# Patient Record
Sex: Female | Born: 1950 | Race: White | Hispanic: No | Marital: Married | Smoking: Never smoker
Health system: Southern US, Community
[De-identification: ages and names within clinical notes are randomized; demographics above are authoritative.]

## PROBLEM LIST (undated history)

## (undated) DIAGNOSIS — F32A Depression, unspecified: Secondary | ICD-10-CM

## (undated) DIAGNOSIS — C569 Malignant neoplasm of unspecified ovary: Secondary | ICD-10-CM

## (undated) DIAGNOSIS — M1990 Unspecified osteoarthritis, unspecified site: Secondary | ICD-10-CM

## (undated) DIAGNOSIS — C4490 Unspecified malignant neoplasm of skin, unspecified: Secondary | ICD-10-CM

## (undated) DIAGNOSIS — T4145XA Adverse effect of unspecified anesthetic, initial encounter: Secondary | ICD-10-CM

## (undated) DIAGNOSIS — E785 Hyperlipidemia, unspecified: Secondary | ICD-10-CM

## (undated) DIAGNOSIS — T8859XA Other complications of anesthesia, initial encounter: Secondary | ICD-10-CM

## (undated) DIAGNOSIS — K269 Duodenal ulcer, unspecified as acute or chronic, without hemorrhage or perforation: Secondary | ICD-10-CM

## (undated) DIAGNOSIS — I10 Essential (primary) hypertension: Secondary | ICD-10-CM

## (undated) DIAGNOSIS — R002 Palpitations: Secondary | ICD-10-CM

## (undated) DIAGNOSIS — R112 Nausea with vomiting, unspecified: Secondary | ICD-10-CM

## (undated) DIAGNOSIS — E039 Hypothyroidism, unspecified: Secondary | ICD-10-CM

## (undated) DIAGNOSIS — Z9889 Other specified postprocedural states: Secondary | ICD-10-CM

## (undated) DIAGNOSIS — F329 Major depressive disorder, single episode, unspecified: Secondary | ICD-10-CM

## (undated) DIAGNOSIS — B9681 Helicobacter pylori [H. pylori] as the cause of diseases classified elsewhere: Secondary | ICD-10-CM

## (undated) DIAGNOSIS — F419 Anxiety disorder, unspecified: Secondary | ICD-10-CM

## (undated) HISTORY — PX: BACK SURGERY: SHX140

## (undated) HISTORY — PX: CERVICAL FUSION: SHX112

## (undated) HISTORY — PX: TONSILLECTOMY: SUR1361

## (undated) HISTORY — PX: ABDOMINAL HYSTERECTOMY: SHX81

---

## 2002-08-31 DIAGNOSIS — C569 Malignant neoplasm of unspecified ovary: Secondary | ICD-10-CM

## 2002-08-31 HISTORY — DX: Malignant neoplasm of unspecified ovary: C56.9

## 2014-07-23 ENCOUNTER — Ambulatory Visit: Payer: Self-pay | Admitting: Internal Medicine

## 2015-10-16 ENCOUNTER — Other Ambulatory Visit: Payer: Self-pay | Admitting: Internal Medicine

## 2015-10-16 DIAGNOSIS — M5030 Other cervical disc degeneration, unspecified cervical region: Secondary | ICD-10-CM

## 2015-10-16 DIAGNOSIS — Z1231 Encounter for screening mammogram for malignant neoplasm of breast: Secondary | ICD-10-CM

## 2015-11-04 ENCOUNTER — Ambulatory Visit
Admission: RE | Admit: 2015-11-04 | Discharge: 2015-11-04 | Disposition: A | Discharge status: Home / Self Care | Payer: BLUE CROSS/BLUE SHIELD | Source: Ambulatory Visit | Attending: Internal Medicine | Admitting: Internal Medicine

## 2015-11-04 DIAGNOSIS — Z1231 Encounter for screening mammogram for malignant neoplasm of breast: Secondary | ICD-10-CM | POA: Diagnosis present

## 2015-11-04 DIAGNOSIS — M50222 Other cervical disc displacement at C5-C6 level: Secondary | ICD-10-CM | POA: Diagnosis not present

## 2015-11-04 DIAGNOSIS — M5030 Other cervical disc degeneration, unspecified cervical region: Secondary | ICD-10-CM

## 2015-11-04 DIAGNOSIS — M4802 Spinal stenosis, cervical region: Secondary | ICD-10-CM | POA: Diagnosis not present

## 2015-11-04 DIAGNOSIS — M50221 Other cervical disc displacement at C4-C5 level: Secondary | ICD-10-CM | POA: Insufficient documentation

## 2015-11-04 HISTORY — DX: Malignant neoplasm of unspecified ovary: C56.9

## 2016-06-09 ENCOUNTER — Encounter: Payer: Self-pay | Admitting: *Deleted

## 2016-06-16 ENCOUNTER — Ambulatory Visit: Payer: BLUE CROSS/BLUE SHIELD | Admitting: Anesthesiology

## 2016-06-16 ENCOUNTER — Ambulatory Visit
Admission: RE | Admit: 2016-06-16 | Discharge: 2016-06-16 | Disposition: A | Discharge status: Home / Self Care | Payer: BLUE CROSS/BLUE SHIELD | Source: Ambulatory Visit | Attending: Ophthalmology | Admitting: Ophthalmology

## 2016-06-16 ENCOUNTER — Encounter: Payer: Self-pay | Admitting: *Deleted

## 2016-06-16 ENCOUNTER — Encounter
Admission: RE | Disposition: A | Discharge status: Home / Self Care | Payer: Self-pay | Source: Ambulatory Visit | Attending: Ophthalmology

## 2016-06-16 DIAGNOSIS — I10 Essential (primary) hypertension: Secondary | ICD-10-CM | POA: Diagnosis not present

## 2016-06-16 DIAGNOSIS — F419 Anxiety disorder, unspecified: Secondary | ICD-10-CM | POA: Diagnosis not present

## 2016-06-16 DIAGNOSIS — Z8543 Personal history of malignant neoplasm of ovary: Secondary | ICD-10-CM | POA: Insufficient documentation

## 2016-06-16 DIAGNOSIS — E7800 Pure hypercholesterolemia, unspecified: Secondary | ICD-10-CM | POA: Diagnosis not present

## 2016-06-16 DIAGNOSIS — H2512 Age-related nuclear cataract, left eye: Secondary | ICD-10-CM | POA: Insufficient documentation

## 2016-06-16 DIAGNOSIS — Z79899 Other long term (current) drug therapy: Secondary | ICD-10-CM | POA: Diagnosis not present

## 2016-06-16 HISTORY — PX: CATARACT EXTRACTION W/PHACO: SHX586

## 2016-06-16 HISTORY — DX: Essential (primary) hypertension: I10

## 2016-06-16 HISTORY — DX: Anxiety disorder, unspecified: F41.9

## 2016-06-16 HISTORY — DX: Adverse effect of unspecified anesthetic, initial encounter: T41.45XA

## 2016-06-16 HISTORY — DX: Other complications of anesthesia, initial encounter: T88.59XA

## 2016-06-16 HISTORY — DX: Other specified postprocedural states: Z98.890

## 2016-06-16 HISTORY — DX: Nausea with vomiting, unspecified: R11.2

## 2016-06-16 SURGERY — PHACOEMULSIFICATION, CATARACT, WITH IOL INSERTION
Anesthesia: Monitor Anesthesia Care | Site: Eye | Laterality: Left | Wound class: Clean

## 2016-06-16 MED ORDER — ONDANSETRON HCL 4 MG/2ML IJ SOLN
INTRAMUSCULAR | Status: DC | PRN
  Administered 2016-06-16: 4 mg via INTRAVENOUS

## 2016-06-16 MED ORDER — NA CHONDROIT SULF-NA HYALURON 40-17 MG/ML IO SOLN
INTRAOCULAR | Status: DC | PRN
  Administered 2016-06-16: 1 mL via INTRAOCULAR

## 2016-06-16 MED ORDER — FENTANYL CITRATE (PF) 100 MCG/2ML IJ SOLN
INTRAMUSCULAR | Status: DC | PRN
  Administered 2016-06-16: 50 ug via INTRAVENOUS

## 2016-06-16 MED ORDER — SODIUM CHLORIDE 0.9 % IV SOLN
INTRAVENOUS | Status: DC
Start: 2016-06-16 — End: 2016-06-16
  Administered 2016-06-16: 07:00:00 via INTRAVENOUS

## 2016-06-16 MED ORDER — ARMC OPHTHALMIC DILATING DROPS
1.0000 "application " | OPHTHALMIC | Status: AC
  Administered 2016-06-16 (×3): 1 via OPHTHALMIC

## 2016-06-16 MED ORDER — POVIDONE-IODINE 5 % OP SOLN
OPHTHALMIC | Status: AC
  Filled 2016-06-16: qty 30

## 2016-06-16 MED ORDER — LIDOCAINE HCL (PF) 4 % IJ SOLN
INTRAOCULAR | Status: DC | PRN
  Administered 2016-06-16: 4 mL via OPHTHALMIC

## 2016-06-16 MED ORDER — LIDOCAINE HCL (PF) 4 % IJ SOLN
INTRAMUSCULAR | Status: AC
  Filled 2016-06-16: qty 5

## 2016-06-16 MED ORDER — EPINEPHRINE PF 1 MG/ML IJ SOLN
INTRAMUSCULAR | Status: AC
  Filled 2016-06-16: qty 2

## 2016-06-16 MED ORDER — EPINEPHRINE PF 1 MG/ML IJ SOLN
INTRAOCULAR | Status: DC | PRN
  Administered 2016-06-16: 250 mL via OPHTHALMIC

## 2016-06-16 MED ORDER — MOXIFLOXACIN HCL 0.5 % OP SOLN
1.0000 [drp] | OPHTHALMIC | Status: AC
  Administered 2016-06-16 (×3): 1 [drp] via OPHTHALMIC

## 2016-06-16 MED ORDER — MIDAZOLAM HCL 2 MG/2ML IJ SOLN
INTRAMUSCULAR | Status: DC | PRN
  Administered 2016-06-16: 1 mg via INTRAVENOUS

## 2016-06-16 MED ORDER — CARBACHOL 0.01 % IO SOLN
INTRAOCULAR | Status: DC | PRN
  Administered 2016-06-16: 0.5 mL via INTRAOCULAR

## 2016-06-16 MED ORDER — NA CHONDROIT SULF-NA HYALURON 40-17 MG/ML IO SOLN
INTRAOCULAR | Status: AC
  Filled 2016-06-16: qty 1

## 2016-06-16 MED ORDER — MOXIFLOXACIN HCL 0.5 % OP SOLN
OPHTHALMIC | Status: DC | PRN
  Administered 2016-06-16: 9 [drp] via OPHTHALMIC

## 2016-06-16 SURGICAL SUPPLY — 21 items
CANNULA ANT/CHMB 27GA (MISCELLANEOUS) ×3 IMPLANT
CUP MEDICINE 2OZ PLAST GRAD ST (MISCELLANEOUS) ×3 IMPLANT
GLOVE BIO SURGEON STRL SZ8 (GLOVE) ×3 IMPLANT
GLOVE BIOGEL M 6.5 STRL (GLOVE) ×3 IMPLANT
GLOVE SURG LX 8.0 MICRO (GLOVE) ×2
GLOVE SURG LX STRL 8.0 MICRO (GLOVE) ×1 IMPLANT
GOWN STRL REUS W/ TWL LRG LVL3 (GOWN DISPOSABLE) ×2 IMPLANT
GOWN STRL REUS W/TWL LRG LVL3 (GOWN DISPOSABLE) ×4
LENS IOL TECNIS SYMFONY 20.5 ×3 IMPLANT
PACK CATARACT (MISCELLANEOUS) ×3 IMPLANT
PACK CATARACT BRASINGTON LX (MISCELLANEOUS) ×3 IMPLANT
PACK EYE AFTER SURG (MISCELLANEOUS) ×3 IMPLANT
SOL BSS BAG (MISCELLANEOUS) ×3
SOL PREP PVP 2OZ (MISCELLANEOUS) ×3
SOLUTION BSS BAG (MISCELLANEOUS) ×1 IMPLANT
SOLUTION PREP PVP 2OZ (MISCELLANEOUS) ×1 IMPLANT
SYR 3ML LL SCALE MARK (SYRINGE) ×3 IMPLANT
SYR 5ML LL (SYRINGE) ×3 IMPLANT
SYR TB 1ML 27GX1/2 LL (SYRINGE) ×3 IMPLANT
WATER STERILE IRR 250ML POUR (IV SOLUTION) ×3 IMPLANT
WIPE NON LINTING 3.25X3.25 (MISCELLANEOUS) ×3 IMPLANT

## 2016-06-16 NOTE — H&P (Signed)
All labs reviewed. Abnormal studies sent to patients PCP when indicated.  Previous H&P reviewed, patient examined, there are NO CHANGES.  Christina Sullivan LOUIS10/17/20177:58 AM

## 2016-06-16 NOTE — Anesthesia Procedure Notes (Signed)
Procedure Name: MAC Date/Time: 06/16/2016 8:03 AM Performed by: Doreen Salvage Pre-anesthesia Checklist: Patient identified, Emergency Drugs available, Suction available and Patient being monitored Patient Re-evaluated:Patient Re-evaluated prior to inductionOxygen Delivery Method: Nasal cannula

## 2016-06-16 NOTE — Discharge Instructions (Signed)
Eye Surgery Discharge Instructions  Expect mild scratchy sensation or mild soreness. DO NOT RUB YOUR EYE!  The day of surgery:  Minimal physical activity, but bed rest is not required  No reading, computer work, or close hand work  No bending, lifting, or straining.  May watch TV  For 24 hours:  No driving, legal decisions, or alcoholic beverages  Safety precautions  Eat anything you prefer: It is better to start with liquids, then soup then solid foods.  _____ Eye patch should be worn until postoperative exam tomorrow.  ____ Solar shield eyeglasses should be worn for comfort in the sunlight/patch while sleeping  Resume all regular medications including aspirin or Coumadin if these were discontinued prior to surgery. You may shower, bathe, shave, or wash your hair. Tylenol may be taken for mild discomfort.  Call your doctor if you experience significant pain, nausea, or vomiting, fever > 101 or other signs of infection. [phone] or [phone] Specific instructions:  Follow-up Information    PORFILIO,WILLIAM LOUIS, MD Follow up on 06/17/2016.   Specialty:  Ophthalmology Why:  1000 Contact information: [address] [place] [address] [phone]

## 2016-06-16 NOTE — Anesthesia Postprocedure Evaluation (Signed)
Anesthesia Post Note  Patient: Christina Sullivan  Procedure(s) Performed: Procedure(s) (LRB): CATARACT EXTRACTION PHACO AND INTRAOCULAR LENS PLACEMENT (IOC) (Left)  Patient location during evaluation: PACU Anesthesia Type: MAC Level of consciousness: awake and awake and alert Pain management: pain level controlled Vital Signs Assessment: post-procedure vital signs reviewed and stable Respiratory status: spontaneous breathing Cardiovascular status: blood pressure returned to baseline Postop Assessment: no signs of nausea or vomiting, no backache and no headache Anesthetic complications: no    Last Vitals:  Vitals:   06/16/16 0640 06/16/16 0826  BP: 132/71 107/89  Pulse: 80 71  Resp: 16 12  Temp: 36.6 C 36.6 C    Last Pain:  Vitals:   06/16/16 0826  TempSrc: Oral                 Alison Stalling

## 2016-06-16 NOTE — Transfer of Care (Addendum)
Immediate Anesthesia Transfer of Care Note  Patient: Christina Sullivan  Procedure(s) Performed: Procedure(s) with comments: CATARACT EXTRACTION PHACO AND INTRAOCULAR LENS PLACEMENT (IOC) (Left) - Lot# [id number] H [place]: 00:50.4 AP%: 20.2 CDE:10.16  Patient Location: PHASE II  Anesthesia Type:MAC  Level of Consciousness: Awake, Alert, Oriented  Airway & Oxygen Therapy: Patient Spontanous Breathing and Patient on room air   Post-op Assessment: Report given to RN and Post -op Vital signs reviewed and stable  Post vital signs: Reviewed and stable  Last Vitals:  Vitals:   06/16/16 0640 06/16/16 0826  BP: 132/71 107/89  Pulse: 80 71  Resp: 16 12  Temp: 36.6 C 123XX123 C    Complications: No apparent anesthesia complications

## 2016-06-16 NOTE — Op Note (Signed)
PREOPERATIVE DIAGNOSIS:  Nuclear sclerotic cataract of the left eye.   POSTOPERATIVE DIAGNOSIS:  Nuclear sclerotic cataract of the left eye.   OPERATIVE PROCEDURE: Procedure(s): CATARACT EXTRACTION PHACO AND INTRAOCULAR LENS PLACEMENT (IOC)   SURGEON:  Birder Robson, MD.   ANESTHESIA:  Anesthesiologist: Martha Clan, MD CRNA: Doreen Salvage, CRNA  1.      Managed anesthesia care. 2.     0.32ml of Shugarcaine was instilled following the paracentesis   COMPLICATIONS:  None.   TECHNIQUE:   Stop and chop   DESCRIPTION OF PROCEDURE:  The patient was examined and consented in the preoperative holding area where the aforementioned topical anesthesia was applied to the left eye and then brought back to the Operating Room where the left eye was prepped and draped in the usual sterile ophthalmic fashion and a lid speculum was placed. A paracentesis was created with the side port blade and the anterior chamber was filled with viscoelastic. A near clear corneal incision was performed with the steel keratome. A continuous curvilinear capsulorrhexis was performed with a cystotome followed by the capsulorrhexis forceps. Hydrodissection and hydrodelineation were carried out with BSS on a blunt cannula. The lens was removed in a stop and chop  technique and the remaining cortical material was removed with the irrigation-aspiration handpiece. The capsular bag was inflated with viscoelastic and the Technis [id number] lens was placed in the capsular bag without complication. The remaining viscoelastic was removed from the eye with the irrigation-aspiration handpiece. The wounds were hydrated. The anterior chamber was flushed with Miostat and the eye was inflated to physiologic pressure. 0.44ml Vigamox was placed in the anterior chamber. The wounds were found to be water tight. The eye was dressed with Vigamox. The patient was given protective glasses to wear throughout the day and a shield with which to sleep tonight.  The patient was also given drops with which to begin a drop regimen today and will follow-up with me in one day.  Implant Name Type Inv. Item Serial No. Manufacturer Lot No. LRB No. Used  LENS IOL TECNIS SYMFONY 20.5D - [id number]   LENS IOL TECNIS SYMFONY 20.5D [id number] [place]   Left 1    Procedure(s) with comments: CATARACT EXTRACTION PHACO AND INTRAOCULAR LENS PLACEMENT (IOC) (Left) - Lot# [id number] H [place]: 00:50.4 AP%: 20.2 CDE:10.16  Electronically signed: [place] 06/16/2016 8:25 AM

## 2016-06-16 NOTE — Anesthesia Preprocedure Evaluation (Signed)
Anesthesia Evaluation  Patient identified by MRN, date of birth, ID band Patient awake    Reviewed: Allergy & Precautions, H&P , NPO status , Patient's Chart, lab work & pertinent test results, reviewed documented beta blocker date and time   History of Anesthesia Complications (+) PONV and history of anesthetic complications  Airway Mallampati: II  TM Distance: >3 FB Neck ROM: full    Dental no notable dental hx. (+) Teeth Intact   Pulmonary neg pulmonary ROS,    Pulmonary exam normal breath sounds clear to auscultation       Cardiovascular Exercise Tolerance: Good hypertension, (-) angina(-) CAD, (-) Past MI, (-) Cardiac Stents and (-) CABG Normal cardiovascular exam(-) dysrhythmias (-) Valvular Problems/Murmurs Rhythm:regular Rate:Normal     Neuro/Psych negative neurological ROS  negative psych ROS   GI/Hepatic negative GI ROS, Neg liver ROS,   Endo/Other  negative endocrine ROS  Renal/GU negative Renal ROS  negative genitourinary   Musculoskeletal   Abdominal   Peds  Hematology negative hematology ROS (+)   Anesthesia Other Findings Past Medical History: No date: Anxiety No date: Complication of anesthesia No date: Hypertension 2004: Ovarian cancer ([place]) No date: PONV (postoperative nausea and vomiting)   Reproductive/Obstetrics negative OB ROS                             Anesthesia Physical Anesthesia Plan  ASA: II  Anesthesia Plan: MAC   Post-op Pain Management:    Induction:   Airway Management Planned:   Additional Equipment:   Intra-op Plan:   Post-operative Plan:   Informed Consent: I have reviewed the patients History and Physical, chart, labs and discussed the procedure including the risks, benefits and alternatives for the proposed anesthesia with the patient or authorized representative who has indicated his/her understanding and acceptance.   Dental  Advisory Given  Plan Discussed with: Anesthesiologist, CRNA and Surgeon  Anesthesia Plan Comments:         Anesthesia Quick Evaluation

## 2016-07-01 ENCOUNTER — Encounter: Payer: Self-pay | Admitting: *Deleted

## 2016-07-07 ENCOUNTER — Encounter
Admission: RE | Disposition: A | Discharge status: Home / Self Care | Payer: Self-pay | Source: Ambulatory Visit | Attending: Ophthalmology

## 2016-07-07 ENCOUNTER — Ambulatory Visit
Admission: RE | Admit: 2016-07-07 | Discharge: 2016-07-07 | Disposition: A | Discharge status: Home / Self Care | Payer: BLUE CROSS/BLUE SHIELD | Source: Ambulatory Visit | Attending: Ophthalmology | Admitting: Ophthalmology

## 2016-07-07 ENCOUNTER — Ambulatory Visit: Payer: BLUE CROSS/BLUE SHIELD | Admitting: Anesthesiology

## 2016-07-07 ENCOUNTER — Encounter: Payer: Self-pay | Admitting: *Deleted

## 2016-07-07 DIAGNOSIS — F419 Anxiety disorder, unspecified: Secondary | ICD-10-CM | POA: Insufficient documentation

## 2016-07-07 DIAGNOSIS — I10 Essential (primary) hypertension: Secondary | ICD-10-CM | POA: Diagnosis not present

## 2016-07-07 DIAGNOSIS — Z6830 Body mass index (BMI) 30.0-30.9, adult: Secondary | ICD-10-CM | POA: Insufficient documentation

## 2016-07-07 DIAGNOSIS — H2511 Age-related nuclear cataract, right eye: Secondary | ICD-10-CM | POA: Diagnosis not present

## 2016-07-07 HISTORY — PX: CATARACT EXTRACTION W/PHACO: SHX586

## 2016-07-07 SURGERY — PHACOEMULSIFICATION, CATARACT, WITH IOL INSERTION
Anesthesia: Monitor Anesthesia Care | Site: Eye | Laterality: Right | Wound class: Clean

## 2016-07-07 MED ORDER — POVIDONE-IODINE 5 % OP SOLN
OPHTHALMIC | Status: AC
  Filled 2016-07-07: qty 30

## 2016-07-07 MED ORDER — MOXIFLOXACIN HCL 0.5 % OP SOLN
OPHTHALMIC | Status: AC
Start: 2016-07-07 — End: 2016-07-07
  Administered 2016-07-07: 1 [drp] via OPHTHALMIC
  Filled 2016-07-07: qty 3

## 2016-07-07 MED ORDER — ARMC OPHTHALMIC DILATING DROPS
OPHTHALMIC | Status: AC
  Administered 2016-07-07: 1 via OPHTHALMIC
  Filled 2016-07-07: qty 0.4

## 2016-07-07 MED ORDER — NA CHONDROIT SULF-NA HYALURON 40-17 MG/ML IO SOLN
INTRAOCULAR | Status: DC | PRN
  Administered 2016-07-07: 1 mL via INTRAOCULAR

## 2016-07-07 MED ORDER — SODIUM CHLORIDE 0.9 % IV SOLN
INTRAVENOUS | Status: DC
  Administered 2016-07-07: 08:00:00 via INTRAVENOUS

## 2016-07-07 MED ORDER — MOXIFLOXACIN HCL 0.5 % OP SOLN
OPHTHALMIC | Status: DC | PRN
  Administered 2016-07-07: 9 [drp] via OPHTHALMIC

## 2016-07-07 MED ORDER — MOXIFLOXACIN HCL 0.5 % OP SOLN
1.0000 [drp] | OPHTHALMIC | Status: AC | PRN
  Administered 2016-07-07 (×3): 1 [drp] via OPHTHALMIC

## 2016-07-07 MED ORDER — MIDAZOLAM HCL 2 MG/2ML IJ SOLN
INTRAMUSCULAR | Status: DC | PRN
  Administered 2016-07-07: 1 mg via INTRAVENOUS

## 2016-07-07 MED ORDER — ARMC OPHTHALMIC DILATING DROPS
1.0000 "application " | OPHTHALMIC | Status: AC | PRN
  Administered 2016-07-07 (×3): 1 via OPHTHALMIC

## 2016-07-07 MED ORDER — LIDOCAINE HCL (PF) 4 % IJ SOLN
INTRAMUSCULAR | Status: AC
  Filled 2016-07-07: qty 5

## 2016-07-07 MED ORDER — EPINEPHRINE PF 1 MG/ML IJ SOLN
INTRAMUSCULAR | Status: AC
  Filled 2016-07-07: qty 2

## 2016-07-07 MED ORDER — EPINEPHRINE PF 1 MG/ML IJ SOLN
INTRAOCULAR | Status: DC | PRN
  Administered 2016-07-07: 200 mL via OPHTHALMIC

## 2016-07-07 MED ORDER — CARBACHOL 0.01 % IO SOLN
INTRAOCULAR | Status: DC | PRN
  Administered 2016-07-07: 0.5 mL via INTRAOCULAR

## 2016-07-07 MED ORDER — NA CHONDROIT SULF-NA HYALURON 40-17 MG/ML IO SOLN
INTRAOCULAR | Status: AC
  Filled 2016-07-07: qty 1

## 2016-07-07 MED ORDER — LIDOCAINE HCL (PF) 4 % IJ SOLN
INTRAOCULAR | Status: DC | PRN
  Administered 2016-07-07: 4 mL via OPHTHALMIC

## 2016-07-07 MED ORDER — FENTANYL CITRATE (PF) 100 MCG/2ML IJ SOLN
INTRAMUSCULAR | Status: DC | PRN
  Administered 2016-07-07: 50 ug via INTRAVENOUS

## 2016-07-07 SURGICAL SUPPLY — 21 items
CANNULA ANT/CHMB 27GA (MISCELLANEOUS) ×3 IMPLANT
CUP MEDICINE 2OZ PLAST GRAD ST (MISCELLANEOUS) ×3 IMPLANT
GLOVE BIO SURGEON STRL SZ8 (GLOVE) ×3 IMPLANT
GLOVE BIOGEL M 6.5 STRL (GLOVE) ×3 IMPLANT
GLOVE SURG LX 8.0 MICRO (GLOVE) ×2
GLOVE SURG LX STRL 8.0 MICRO (GLOVE) ×1 IMPLANT
GOWN STRL REUS W/ TWL LRG LVL3 (GOWN DISPOSABLE) ×2 IMPLANT
GOWN STRL REUS W/TWL LRG LVL3 (GOWN DISPOSABLE) ×4
LENS IOL TECNIS SYMFONY 21.0 ×3 IMPLANT
PACK CATARACT (MISCELLANEOUS) ×3 IMPLANT
PACK CATARACT BRASINGTON LX (MISCELLANEOUS) ×3 IMPLANT
PACK EYE AFTER SURG (MISCELLANEOUS) ×3 IMPLANT
SOL BSS BAG (MISCELLANEOUS) ×3
SOL PREP PVP 2OZ (MISCELLANEOUS) ×3
SOLUTION BSS BAG (MISCELLANEOUS) ×1 IMPLANT
SOLUTION PREP PVP 2OZ (MISCELLANEOUS) ×1 IMPLANT
SYR 3ML LL SCALE MARK (SYRINGE) ×3 IMPLANT
SYR 5ML LL (SYRINGE) ×3 IMPLANT
SYR TB 1ML 27GX1/2 LL (SYRINGE) ×3 IMPLANT
WATER STERILE IRR 250ML POUR (IV SOLUTION) ×3 IMPLANT
WIPE NON LINTING 3.25X3.25 (MISCELLANEOUS) ×3 IMPLANT

## 2016-07-07 NOTE — Op Note (Signed)
PREOPERATIVE DIAGNOSIS:  Nuclear sclerotic cataract of the right eye.   POSTOPERATIVE DIAGNOSIS:  right nuclear sclerotic CATARACT   OPERATIVE PROCEDURE: Procedure(s): CATARACT EXTRACTION PHACO AND INTRAOCULAR LENS PLACEMENT (IOC)   SURGEON:  Birder Robson, MD.   ANESTHESIA:  Anesthesiologist: Emmie Niemann, MD CRNA: Silvana Newness, CRNA; Darlyne Russian, CRNA  1.      Managed anesthesia care. 2.      0.35ml of Shugarcaine was instilled in the eye following the paracentesis.   COMPLICATIONS:  None.   TECHNIQUE:   Stop and chop   DESCRIPTION OF PROCEDURE:  The patient was examined and consented in the preoperative holding area where the aforementioned topical anesthesia was applied to the right eye and then brought back to the Operating Room where the right eye was prepped and draped in the usual sterile ophthalmic fashion and a lid speculum was placed. A paracentesis was created with the side port blade and the anterior chamber was filled with viscoelastic. A near clear corneal incision was performed with the steel keratome. A continuous curvilinear capsulorrhexis was performed with a cystotome followed by the capsulorrhexis forceps. Hydrodissection and hydrodelineation were carried out with BSS on a blunt cannula. The lens was removed in a stop and chop  technique and the remaining cortical material was removed with the irrigation-aspiration handpiece. The capsular bag was inflated with viscoelastic and the Technis ZCB00  lens was placed in the capsular bag without complication. The remaining viscoelastic was removed from the eye with the irrigation-aspiration handpiece. The wounds were hydrated. The anterior chamber was flushed with Miostat and the eye was inflated to physiologic pressure. 0.4ml of Vigamox was placed in the anterior chamber. The wounds were found to be water tight. The eye was dressed with Vigamox. The patient was given protective glasses to wear throughout the day and a shield  with which to sleep tonight. The patient was also given drops with which to begin a drop regimen today and will follow-up with me in one day.  Implant Name Type Inv. Item Serial No. Manufacturer Lot No. LRB No. Used  LENS IOL TECNIS SYMFONY 21.0D - [id number]   LENS IOL TECNIS SYMFONY 21.0D [id number] [place]   Right 1   Procedure(s) with comments: CATARACT EXTRACTION PHACO AND INTRAOCULAR LENS PLACEMENT (IOC) (Right) - Lot # [id number] H [place]: 00:28.8 AP%:18.8 CDE: 5.43  Electronically signed: [place] 07/07/2016 9:54 AM

## 2016-07-07 NOTE — H&P (Signed)
All labs reviewed. Abnormal studies sent to patients PCP when indicated.  Previous H&P reviewed, patient examined, there are NO CHANGES.  Thecla Forgione LOUIS11/7/20179:26 AM

## 2016-07-07 NOTE — Anesthesia Preprocedure Evaluation (Signed)
Anesthesia Evaluation  Patient identified by MRN, date of birth, ID band Patient awake    Reviewed: Allergy & Precautions, H&P , NPO status , Patient's Chart, lab work & pertinent test results  History of Anesthesia Complications (+) PONV and history of anesthetic complications  Airway Mallampati: II  TM Distance: >3 FB Neck ROM: full    Dental no notable dental hx. (+) Teeth Intact   Pulmonary neg pulmonary ROS, neg sleep apnea, neg COPD,    Pulmonary exam normal breath sounds clear to auscultation- rhonchi (-) wheezing      Cardiovascular Exercise Tolerance: Good hypertension, (-) angina(-) CAD, (-) Past MI, (-) Cardiac Stents and (-) CABG Normal cardiovascular exam(-) dysrhythmias (-) Valvular Problems/Murmurs Rhythm:regular Rate:Normal - Systolic murmurs and - Diastolic murmurs    Neuro/Psych Anxiety negative neurological ROS     GI/Hepatic negative GI ROS, Neg liver ROS,   Endo/Other  negative endocrine ROSneg diabetes  Renal/GU negative Renal ROS  negative genitourinary   Musculoskeletal   Abdominal (+) + obese,   Peds  Hematology negative hematology ROS (+)   Anesthesia Other Findings Past Medical History: No date: Anxiety No date: Complication of anesthesia No date: Hypertension 2004: Ovarian cancer ([place]) No date: PONV (postoperative nausea and vomiting)   Reproductive/Obstetrics negative OB ROS                             Anesthesia Physical  Anesthesia Plan  ASA: II  Anesthesia Plan: MAC   Post-op Pain Management:    Induction:   Airway Management Planned: Natural Airway  Additional Equipment:   Intra-op Plan:   Post-operative Plan:   Informed Consent: I have reviewed the patients History and Physical, chart, labs and discussed the procedure including the risks, benefits and alternatives for the proposed anesthesia with the patient or authorized representative  who has indicated his/her understanding and acceptance.   Dental Advisory Given  Plan Discussed with: Anesthesiologist, CRNA and Surgeon  Anesthesia Plan Comments:         Anesthesia Quick Evaluation

## 2016-07-07 NOTE — Anesthesia Postprocedure Evaluation (Signed)
Anesthesia Post Note  Patient: Rakhia Steyer  Procedure(s) Performed: Procedure(s) (LRB): CATARACT EXTRACTION PHACO AND INTRAOCULAR LENS PLACEMENT (IOC) (Right)  Patient location during evaluation: PACU Anesthesia Type: MAC Level of consciousness: awake, awake and alert and oriented Vital Signs Assessment: post-procedure vital signs reviewed and stable Respiratory status: spontaneous breathing, nonlabored ventilation and respiratory function stable Cardiovascular status: blood pressure returned to baseline and stable Postop Assessment: no signs of nausea or vomiting Anesthetic complications: no    Last Vitals:  Vitals:   07/07/16 0800 07/07/16 0957  BP: (!) 150/92 (!) 148/79  Pulse: 80 70  Resp: 16 16  Temp: 36.3 C     Last Pain:  Vitals:   07/07/16 0957  TempSrc: Oral  PainSc:                  Darlyne Russian

## 2016-07-07 NOTE — Discharge Instructions (Signed)
Eye Surgery Discharge Instructions  Expect mild scratchy sensation or mild soreness. DO NOT RUB YOUR EYE!  The day of surgery:  Minimal physical activity, but bed rest is not required  No reading, computer work, or close hand work  No bending, lifting, or straining.  May watch TV  For 24 hours:  No driving, legal decisions, or alcoholic beverages  Safety precautions  Eat anything you prefer: It is better to start with liquids, then soup then solid foods.  _____ Eye patch should be worn until postoperative exam tomorrow.  ____ Solar shield eyeglasses should be worn for comfort in the sunlight/patch while sleeping  Resume all regular medications including aspirin or Coumadin if these were discontinued prior to surgery. You may shower, bathe, shave, or wash your hair. Tylenol may be taken for mild discomfort.  Call your doctor if you experience significant pain, nausea, or vomiting, fever > 101 or other signs of infection. [phone] or [phone] Specific instructions:  Follow-up Information    Christina Sullivan,Christina LOUIS, MD Follow up on 07/08/2016.   Specialty:  Ophthalmology Why:  10:35 Contact information: [address] [place] [address] [phone]

## 2016-07-07 NOTE — Transfer of Care (Signed)
Immediate Anesthesia Transfer of Care Note  Patient: Christina Sullivan  Procedure(s) Performed: Procedure(s) with comments: CATARACT EXTRACTION PHACO AND INTRAOCULAR LENS PLACEMENT (IOC) (Right) - Lot # [id number] H [place]: 00:28.8 AP%:18.8 CDE: 5.43  Patient Location: PACU  Anesthesia Type:MAC  Level of Consciousness: awake, alert  and oriented  Airway & Oxygen Therapy: Patient Spontanous Breathing  Post-op Assessment: Report given to RN and Post -op Vital signs reviewed and stable  Post vital signs: Reviewed and stable  Last Vitals:  Vitals:   07/07/16 0800  BP: (!) 150/92  Pulse: 80  Resp: 16  Temp: 36.3 C    Last Pain:  Vitals:   07/07/16 0800  TempSrc: Tympanic  PainSc: 0-No pain         Complications: No apparent anesthesia complications

## 2016-07-08 ENCOUNTER — Encounter: Payer: Self-pay | Admitting: Ophthalmology

## 2016-10-23 ENCOUNTER — Other Ambulatory Visit: Payer: Self-pay | Admitting: Internal Medicine

## 2016-10-23 DIAGNOSIS — Z1231 Encounter for screening mammogram for malignant neoplasm of breast: Secondary | ICD-10-CM

## 2016-11-13 ENCOUNTER — Ambulatory Visit
Admission: RE | Admit: 2016-11-13 | Discharge: 2016-11-13 | Disposition: A | Discharge status: Home / Self Care | Payer: Medicare Other | Source: Ambulatory Visit | Attending: Internal Medicine | Admitting: Internal Medicine

## 2016-11-13 DIAGNOSIS — Z1231 Encounter for screening mammogram for malignant neoplasm of breast: Secondary | ICD-10-CM | POA: Diagnosis not present

## 2017-01-17 ENCOUNTER — Emergency Department: Payer: Medicare Other

## 2017-01-17 ENCOUNTER — Emergency Department
Admission: EM | Admit: 2017-01-17 | Discharge: 2017-01-17 | Disposition: A | Discharge status: Home / Self Care | Payer: Medicare Other | Attending: Emergency Medicine | Admitting: Emergency Medicine

## 2017-01-17 DIAGNOSIS — R002 Palpitations: Secondary | ICD-10-CM | POA: Insufficient documentation

## 2017-01-17 DIAGNOSIS — I10 Essential (primary) hypertension: Secondary | ICD-10-CM | POA: Diagnosis not present

## 2017-01-17 DIAGNOSIS — Z8543 Personal history of malignant neoplasm of ovary: Secondary | ICD-10-CM | POA: Diagnosis not present

## 2017-01-17 DIAGNOSIS — Z79899 Other long term (current) drug therapy: Secondary | ICD-10-CM | POA: Insufficient documentation

## 2017-01-17 LAB — BASIC METABOLIC PANEL
ANION GAP: 10 (ref 5–15)
BUN: 24 mg/dL — AB (ref 6–20)
CALCIUM: 9.5 mg/dL (ref 8.9–10.3)
CO2: 27 mmol/L (ref 22–32)
Chloride: 102 mmol/L (ref 101–111)
Creatinine, Ser: 1.15 mg/dL — ABNORMAL HIGH (ref 0.44–1.00)
GFR calc Af Amer: 57 mL/min — ABNORMAL LOW (ref >60)
GFR, EST NON AFRICAN AMERICAN: 49 mL/min — AB (ref >60)
GLUCOSE: 103 mg/dL — AB (ref 65–99)
Potassium: 3.1 mmol/L — ABNORMAL LOW (ref 3.5–5.1)
SODIUM: 139 mmol/L (ref 135–145)

## 2017-01-17 LAB — CBC
HCT: 46.7 % (ref 35.0–47.0)
HEMOGLOBIN: 16.5 g/dL — AB (ref 12.0–16.0)
MCH: 31.8 pg (ref 26.0–34.0)
MCHC: 35.2 g/dL (ref 32.0–36.0)
MCV: 90.2 fL (ref 80.0–100.0)
Platelets: 322 10*3/uL (ref 150–440)
RBC: 5.18 MIL/uL (ref 3.80–5.20)
RDW: 12.4 % (ref 11.5–14.5)
WBC: 7.9 10*3/uL (ref 3.6–11.0)

## 2017-01-17 LAB — TROPONIN I

## 2017-01-17 MED ORDER — POTASSIUM CHLORIDE CRYS ER 20 MEQ PO TBCR
40.0000 meq | EXTENDED_RELEASE_TABLET | Freq: Once | ORAL | Status: AC
  Administered 2017-01-17: 40 meq via ORAL
  Filled 2017-01-17: qty 2

## 2017-01-17 NOTE — ED Triage Notes (Signed)
Pt came to ED via pov c/o heart palpitations. Reports this has been going on for over a month, has been seen for this problem before, nothing found. Pt reports it usually goes away, but last night it has been constant with some sob.

## 2017-01-17 NOTE — ED Provider Notes (Signed)
[facility] Emergency Department Provider Note  ____________________________________________   First MD Initiated Contact with Patient 01/17/17 1502     (approximate)  I have reviewed the triage vital signs and the nursing notes.   HISTORY  Chief Complaint Palpitations   HPI Christina Sullivan is a 66 y.o. female with a history of hypertension and anxiety who is presenting to the emergency Department today with palpitations. She says that she has palpitations intermittently and has had these for several months this time. However, over the past week she has had episodes last about 5 minutes at a time. However, last night she said it lasted all night. Because of this, she says her husband made her come to the hospital this morningfor further evaluation. She is not having any palpation at this time. Not having any shortness of breath at this time. Denies any chest pain. Said that before this week she had a similar episode several months ago. She told her primary care doctor, Dr. Doy Hutching, who advised her to drink more fluids. She says she is drinking clinically fluid at this time. Denies any drinking or drug use. Says that when she was feeling the palpitations she would feel her heart skipping a beat. Also thinks that her heart was beating rapidly.   Past Medical History:  Diagnosis Date  . Anxiety   . Complication of anesthesia   . Hypertension   . Ovarian cancer ([place]) 2004  . PONV (postoperative nausea and vomiting)     There are no active problems to display for this patient.   Past Surgical History:  Procedure Laterality Date  . ABDOMINAL HYSTERECTOMY    . BACK SURGERY     cervical fusion 2 weeks ago  . CATARACT EXTRACTION W/PHACO Left 06/16/2016   Procedure: CATARACT EXTRACTION PHACO AND INTRAOCULAR LENS PLACEMENT (IOC);  Surgeon: Birder Robson, MD;  Location: [facility] ORS;  Service: Ophthalmology;  Laterality: Left;  Lot# [id number] H [place]: 00:50.4 AP%:  20.2 CDE:10.16  . CATARACT EXTRACTION W/PHACO Right 07/07/2016   Procedure: CATARACT EXTRACTION PHACO AND INTRAOCULAR LENS PLACEMENT (IOC);  Surgeon: Birder Robson, MD;  Location: [facility] ORS;  Service: Ophthalmology;  Laterality: Right;  Lot # [id number] H [place]: 00:28.8 AP%:18.8 CDE: 5.43  . CERVICAL FUSION     APPROX 06/14/16  . TONSILLECTOMY      Prior to Admission medications   Medication Sig Start Date End Date Taking? Authorizing Provider  atorvastatin (LIPITOR) 10 MG tablet Take 10 mg by mouth daily.    [provider]  Cholecalciferol (VITAMIN D3) 1000 units CAPS Take 1,000 Units by mouth daily.     [provider]  Difluprednate (DUREZOL OP) Place 1 drop into the left eye 2 (two) times daily.    [provider]  naproxen sodium (ANAPROX) 220 MG tablet Take 220 mg by mouth daily as needed (pain).    [provider]  sertraline (ZOLOFT) 50 MG tablet Take 25 mg by mouth daily.     [provider]  triamterene-hydrochlorothiazide (MAXZIDE-25) 37.5-25 MG tablet Take 1 tablet by mouth daily. 06/12/16   [provider]    Allergies Patient has no known allergies.  Family History  Problem Relation Age of Onset  . Breast cancer Maternal Aunt     Social History Social History  Substance Use Topics  . Smoking status: Never Smoker  . Smokeless tobacco: Never Used  . Alcohol use No    Review of Systems  Constitutional: No fever/chills Eyes: No visual changes. ENT:  No sore throat. Cardiovascular: as above Respiratory: as above Gastrointestinal: No abdominal pain.  No nausea, no vomiting.  No diarrhea.  No constipation. Genitourinary: Negative for dysuria. Musculoskeletal: Negative for back pain. Skin: Negative for rash. Neurological: Negative for headaches, focal weakness or numbness.   ____________________________________________   PHYSICAL EXAM:  VITAL SIGNS: ED Triage Vitals  Enc Vitals Group     BP 01/17/17 1250  130/73     Pulse Rate 01/17/17 1250 90     Resp 01/17/17 1250 16     Temp 01/17/17 1250 98.3 F (36.8 C)     Temp Source 01/17/17 1250 Oral     SpO2 01/17/17 1250 98 %     Weight 01/17/17 1248 220 lb (99.8 kg)     Height 01/17/17 1248 5\' 10"  (1.778 m)     Head Circumference --      Peak Flow --      Pain Score --      Pain Loc --      Pain Edu? --      Excl. in [place]? --     Constitutional: Alert and oriented. Well appearing and in no acute distress. Eyes: Conjunctivae are normal.  Head: Atraumatic. Nose: No congestion/rhinnorhea. Mouth/Throat: Mucous membranes are moist.  Neck: No stridor.   Cardiovascular: Normal rate, regular rhythm. Grossly normal heart sounds.   Respiratory: Normal respiratory effort.  No retractions. Lungs CTAB. Gastrointestinal: Soft and nontender. No distention.  Musculoskeletal: No lower extremity tenderness nor edema.   Neurologic:  Normal speech and language. No gross focal neurologic deficits are appreciated. Skin:  Skin is warm, dry and intact. No rash noted. Psychiatric: Mood and affect are normal. Speech and behavior are normal.  ____________________________________________   LABS (all labs ordered are listed, but only abnormal results are displayed)  Labs Reviewed  BASIC METABOLIC PANEL - Abnormal; Notable for the following:       Result Value   Potassium 3.1 (*)    Glucose, Bld 103 (*)    BUN 24 (*)    Creatinine, Ser 1.15 (*)    GFR calc non Af Amer 49 (*)    GFR calc Af Amer 57 (*)    All other components within normal limits  CBC - Abnormal; Notable for the following:    Hemoglobin 16.5 (*)    All other components within normal limits  TROPONIN I   ____________________________________________  EKG  ED ECG REPORT I, Mirissa Lopresti,  Youlanda Roys, the attending physician, personally viewed and interpreted this ECG.   Date: 01/17/2017  EKG Time: 1255  Rate: 85  Rhythm: normal sinus rhythm  Axis: Normal  Intervals:none  ST&T Change:  No ST segment elevation or depression. No abnormal T-wave inversion.  ____________________________________________  RADIOLOGY  No acute finding ____________________________________________   PROCEDURES  Procedure(s) performed:   Procedures  Critical Care performed:   ____________________________________________   INITIAL IMPRESSION / ASSESSMENT AND PLAN / ED COURSE  Pertinent labs & imaging results that were available during my care of the patient were reviewed by me and considered in my medical decision making (see chart for details).  Patient with only slightly low potassium. We'll replete here in the emergency department. Recommended that she follow-up with either her primary care doctor or cardiology for a Holter monitor. Unclear what the arrhythmias at this time. Labs are reassuring. Do not see any reason to keep her for further evaluation or testing in the emergency department here. She is understanding the plan and willing to  comply.      ____________________________________________   FINAL CLINICAL IMPRESSION(S) / ED DIAGNOSES  Palpitations    NEW MEDICATIONS STARTED DURING THIS VISIT:  New Prescriptions   No medications on file     Note:  This document was prepared using Dragon voice recognition software and may include unintentional dictation errors.     Orbie Pyo, MD 01/17/17 1535

## 2017-06-02 ENCOUNTER — Other Ambulatory Visit: Payer: Self-pay | Admitting: Internal Medicine

## 2017-06-02 DIAGNOSIS — M25562 Pain in left knee: Secondary | ICD-10-CM

## 2017-06-07 ENCOUNTER — Ambulatory Visit
Admission: RE | Admit: 2017-06-07 | Discharge: 2017-06-07 | Disposition: A | Discharge status: Home / Self Care | Payer: Medicare Other | Source: Ambulatory Visit | Attending: Internal Medicine | Admitting: Internal Medicine

## 2017-06-07 DIAGNOSIS — M25562 Pain in left knee: Secondary | ICD-10-CM | POA: Diagnosis not present

## 2017-06-18 ENCOUNTER — Encounter: Payer: Self-pay | Admitting: *Deleted

## 2017-06-21 ENCOUNTER — Encounter
Admission: RE | Disposition: A | Discharge status: Home / Self Care | Payer: Self-pay | Source: Ambulatory Visit | Attending: Gastroenterology

## 2017-06-21 ENCOUNTER — Ambulatory Visit: Payer: Medicare Other | Admitting: Anesthesiology

## 2017-06-21 ENCOUNTER — Ambulatory Visit
Admission: RE | Admit: 2017-06-21 | Discharge: 2017-06-21 | Disposition: A | Discharge status: Home / Self Care | Payer: Medicare Other | Source: Ambulatory Visit | Attending: Gastroenterology | Admitting: Gastroenterology

## 2017-06-21 ENCOUNTER — Encounter: Payer: Self-pay | Admitting: Anesthesiology

## 2017-06-21 DIAGNOSIS — Z6834 Body mass index (BMI) 34.0-34.9, adult: Secondary | ICD-10-CM | POA: Diagnosis not present

## 2017-06-21 DIAGNOSIS — Z86010 Personal history of colonic polyps: Secondary | ICD-10-CM | POA: Insufficient documentation

## 2017-06-21 DIAGNOSIS — Q438 Other specified congenital malformations of intestine: Secondary | ICD-10-CM | POA: Insufficient documentation

## 2017-06-21 DIAGNOSIS — Z8543 Personal history of malignant neoplasm of ovary: Secondary | ICD-10-CM | POA: Diagnosis not present

## 2017-06-21 DIAGNOSIS — I10 Essential (primary) hypertension: Secondary | ICD-10-CM | POA: Diagnosis not present

## 2017-06-21 DIAGNOSIS — Z1211 Encounter for screening for malignant neoplasm of colon: Secondary | ICD-10-CM | POA: Insufficient documentation

## 2017-06-21 DIAGNOSIS — K635 Polyp of colon: Secondary | ICD-10-CM | POA: Diagnosis not present

## 2017-06-21 DIAGNOSIS — E669 Obesity, unspecified: Secondary | ICD-10-CM | POA: Insufficient documentation

## 2017-06-21 DIAGNOSIS — F419 Anxiety disorder, unspecified: Secondary | ICD-10-CM | POA: Diagnosis not present

## 2017-06-21 DIAGNOSIS — Z8711 Personal history of peptic ulcer disease: Secondary | ICD-10-CM | POA: Insufficient documentation

## 2017-06-21 DIAGNOSIS — K5730 Diverticulosis of large intestine without perforation or abscess without bleeding: Secondary | ICD-10-CM | POA: Insufficient documentation

## 2017-06-21 DIAGNOSIS — D125 Benign neoplasm of sigmoid colon: Secondary | ICD-10-CM | POA: Diagnosis not present

## 2017-06-21 DIAGNOSIS — Z79899 Other long term (current) drug therapy: Secondary | ICD-10-CM | POA: Insufficient documentation

## 2017-06-21 DIAGNOSIS — E785 Hyperlipidemia, unspecified: Secondary | ICD-10-CM | POA: Diagnosis not present

## 2017-06-21 DIAGNOSIS — F329 Major depressive disorder, single episode, unspecified: Secondary | ICD-10-CM | POA: Insufficient documentation

## 2017-06-21 HISTORY — DX: Duodenal ulcer, unspecified as acute or chronic, without hemorrhage or perforation: K26.9

## 2017-06-21 HISTORY — DX: Major depressive disorder, single episode, unspecified: F32.9

## 2017-06-21 HISTORY — DX: Helicobacter pylori (H. pylori) as the cause of diseases classified elsewhere: B96.81

## 2017-06-21 HISTORY — DX: Depression, unspecified: F32.A

## 2017-06-21 HISTORY — DX: Hyperlipidemia, unspecified: E78.5

## 2017-06-21 HISTORY — PX: COLONOSCOPY WITH PROPOFOL: SHX5780

## 2017-06-21 SURGERY — COLONOSCOPY WITH PROPOFOL
Anesthesia: General

## 2017-06-21 MED ORDER — PROPOFOL 500 MG/50ML IV EMUL
INTRAVENOUS | Status: DC | PRN
  Administered 2017-06-21: 120 ug/kg/min via INTRAVENOUS

## 2017-06-21 MED ORDER — EPHEDRINE SULFATE 50 MG/ML IJ SOLN
INTRAMUSCULAR | Status: DC | PRN
  Administered 2017-06-21 (×2): 5 mg via INTRAVENOUS

## 2017-06-21 MED ORDER — SODIUM CHLORIDE 0.9 % IV SOLN: INTRAVENOUS | Status: DC

## 2017-06-21 MED ORDER — PROPOFOL 10 MG/ML IV BOLUS
INTRAVENOUS | Status: AC
  Filled 2017-06-21: qty 20

## 2017-06-21 MED ORDER — MIDAZOLAM HCL 2 MG/2ML IJ SOLN
INTRAMUSCULAR | Status: DC | PRN
  Administered 2017-06-21: 2 mg via INTRAVENOUS

## 2017-06-21 MED ORDER — PROPOFOL 500 MG/50ML IV EMUL
INTRAVENOUS | Status: AC
  Filled 2017-06-21: qty 50

## 2017-06-21 MED ORDER — ONDANSETRON HCL 4 MG/2ML IJ SOLN
INTRAMUSCULAR | Status: DC | PRN
  Administered 2017-06-21: 4 mg via INTRAVENOUS

## 2017-06-21 MED ORDER — FENTANYL CITRATE (PF) 100 MCG/2ML IJ SOLN
INTRAMUSCULAR | Status: DC | PRN
  Administered 2017-06-21: 50 ug via INTRAVENOUS

## 2017-06-21 MED ORDER — DEXAMETHASONE SODIUM PHOSPHATE 4 MG/ML IJ SOLN
INTRAMUSCULAR | Status: DC | PRN
  Administered 2017-06-21: 5 mg via INTRAVENOUS

## 2017-06-21 MED ORDER — SODIUM CHLORIDE 0.9 % IV SOLN
INTRAVENOUS | Status: DC
  Administered 2017-06-21: 07:00:00 via INTRAVENOUS

## 2017-06-21 MED ORDER — MIDAZOLAM HCL 2 MG/2ML IJ SOLN
INTRAMUSCULAR | Status: AC
  Filled 2017-06-21: qty 2

## 2017-06-21 MED ORDER — FENTANYL CITRATE (PF) 100 MCG/2ML IJ SOLN
INTRAMUSCULAR | Status: AC
  Filled 2017-06-21: qty 2

## 2017-06-21 NOTE — Anesthesia Preprocedure Evaluation (Signed)
Anesthesia Evaluation  Patient identified by MRN, date of birth, ID band Patient awake    Reviewed: Allergy & Precautions, NPO status , Patient's Chart, lab work & pertinent test results  History of Anesthesia Complications (+) PONV and history of anesthetic complications  Airway Mallampati: III  TM Distance: >3 FB Neck ROM: Full    Dental no notable dental hx.    Pulmonary neg pulmonary ROS, neg sleep apnea, neg COPD,    breath sounds clear to auscultation- rhonchi (-) wheezing      Cardiovascular hypertension, Pt. on medications (-) CAD, (-) Past MI and (-) Cardiac Stents  Rhythm:Regular Rate:Normal - Systolic murmurs and - Diastolic murmurs    Neuro/Psych PSYCHIATRIC DISORDERS Anxiety Depression negative neurological ROS     GI/Hepatic Neg liver ROS, PUD,   Endo/Other  negative endocrine ROSneg diabetes  Renal/GU negative Renal ROS     Musculoskeletal negative musculoskeletal ROS (+)   Abdominal (+) + obese,   Peds  Hematology negative hematology ROS (+)   Anesthesia Other Findings Past Medical History: No date: Anxiety No date: Complication of anesthesia No date: Depression No date: Hyperlipidemia No date: Hypertension 2004: Ovarian cancer ([place]) No date: PONV (postoperative nausea and vomiting) No date: Ulcer of the duodenum caused by bacteria (H. pylori)   Reproductive/Obstetrics                             Anesthesia Physical Anesthesia Plan  ASA: II  Anesthesia Plan: General   Post-op Pain Management:    Induction: Intravenous  PONV Risk Score and Plan: 3 and Propofol infusion  Airway Management Planned: Natural Airway  Additional Equipment:   Intra-op Plan:   Post-operative Plan:   Informed Consent: I have reviewed the patients History and Physical, chart, labs and discussed the procedure including the risks, benefits and alternatives for the proposed  anesthesia with the patient or authorized representative who has indicated his/her understanding and acceptance.   Dental advisory given  Plan Discussed with: CRNA and Anesthesiologist  Anesthesia Plan Comments:         Anesthesia Quick Evaluation

## 2017-06-21 NOTE — Anesthesia Postprocedure Evaluation (Signed)
Anesthesia Post Note  Patient: Christina Sullivan  Procedure(s) Performed: COLONOSCOPY WITH PROPOFOL (N/A )  Patient location during evaluation: Endoscopy Anesthesia Type: General Level of consciousness: awake and alert and oriented Pain management: pain level controlled Vital Signs Assessment: post-procedure vital signs reviewed and stable Respiratory status: spontaneous breathing, nonlabored ventilation and respiratory function stable Cardiovascular status: blood pressure returned to baseline and stable Postop Assessment: no signs of nausea or vomiting Anesthetic complications: no     Last Vitals:  Vitals:   06/21/17 0831 06/21/17 0841  BP: (!) 107/94 110/71  Pulse: 72 79  Resp: 14 15  Temp:    SpO2: 97% 96%    Last Pain:  Vitals:   06/21/17 0821  TempSrc: Tympanic                 Deanna Wiater

## 2017-06-21 NOTE — Transfer of Care (Signed)
Immediate Anesthesia Transfer of Care Note  Patient: Christina Sullivan  Procedure(s) Performed: COLONOSCOPY WITH PROPOFOL (N/A )  Patient Location: PACU  Anesthesia Type:General  Level of Consciousness: awake and sedated  Airway & Oxygen Therapy: Patient Spontanous Breathing and Patient connected to nasal cannula oxygen  Post-op Assessment: Report given to RN and Post -op Vital signs reviewed and stable  Post vital signs: Reviewed and stable  Last Vitals:  Vitals:   06/21/17 0708  BP: 140/75  Pulse: 83  Resp: 18  Temp: (!) 36 C  SpO2: 97%    Last Pain:  Vitals:   06/21/17 0708  TempSrc: Tympanic         Complications: No apparent anesthesia complications

## 2017-06-21 NOTE — H&P (Signed)
Outpatient short stay form Pre-procedure 06/21/2017 7:36 AM Christina Sails MD  Primary Physician: Dr. Fulton Reek  Reason for visit:  Colonoscopy  History of present illness:  Patient is a 66 year old female presenting today as above. She has a personal history of adenomatous colon polyps. Her last colonoscopy was 2013. She does have a history of ovarian cancer 2004 treated surgically only. Considered to be in remission 2009. She has done well since then. She takes no aspirin or blood thinning agents.    Current Facility-Administered Medications:  .  0.9 %  sodium chloride infusion, , Intravenous, Continuous, Christina Sails, MD, Last Rate: 20 mL/hr at 06/21/17 0728 .  0.9 %  sodium chloride infusion, , Intravenous, Continuous, Christina Sails, MD  Prescriptions Prior to Admission  Medication Sig Dispense Refill Last Dose  . allopurinol (ZYLOPRIM) 100 MG tablet Take 100 mg by mouth daily.     Marland Kitchen atorvastatin (LIPITOR) 10 MG tablet Take 10 mg by mouth daily.   07/06/2016 at Unknown time  . Cholecalciferol (VITAMIN D3) 1000 units CAPS Take 1,000 Units by mouth daily.    07/06/2016 at Unknown time  . Difluprednate (DUREZOL OP) Place 1 drop into the left eye 2 (two) times daily.   07/07/2016 at Unknown time  . naproxen sodium (ANAPROX) 220 MG tablet Take 220 mg by mouth daily as needed (pain).   06/23/2016  . sertraline (ZOLOFT) 50 MG tablet Take 25 mg by mouth daily.    07/07/2016 at Unknown time  . triamterene-hydrochlorothiazide (MAXZIDE-25) 37.5-25 MG tablet Take 1 tablet by mouth daily.   07/07/2016 at Unknown time     Not on File   Past Medical History:  Diagnosis Date  . Anxiety   . Complication of anesthesia   . Depression   . Hyperlipidemia   . Hypertension   . Ovarian cancer ([place]) 2004  . PONV (postoperative nausea and vomiting)   . Ulcer of the duodenum caused by bacteria (H. pylori)     Review of systems:      Physical Exam    Heart and lungs: Regular  rate and rhythm without rub or gallop, lungs are bilaterally clear    HEENT: Normocephalic atraumatic eyes are anicteric    Other:     Pertinant exam for procedure: Soft nontender nondistended bowel sounds positive normoactive.    Planned proceedures: Colonoscopy and indicated procedures. I have discussed the risks benefits and complications of procedures to include not limited to bleeding, infection, perforation and the risk of sedation and the patient wishes to proceed.    Christina Sails, MD Gastroenterology 06/21/2017  7:36 AM

## 2017-06-21 NOTE — Anesthesia Procedure Notes (Signed)
Performed by: COOK-MARTIN, Trebor Galdamez Pre-anesthesia Checklist: Patient identified, Emergency Drugs available, Suction available, Patient being monitored and Timeout performed Patient Re-evaluated:Patient Re-evaluated prior to induction Oxygen Delivery Method: Nasal cannula Preoxygenation: Pre-oxygenation with 100% oxygen Induction Type: IV induction Placement Confirmation: positive ETCO2 and CO2 detector       

## 2017-06-21 NOTE — Op Note (Signed)
[facility] Gastroenterology Patient Name: Christina Sullivan Procedure Date: 06/21/2017 7:37 AM MRN: [id number] Account #: [id number] Date of Birth: [birth year] Admit Type: Outpatient Age: 66 Room: [facility] ENDO ROOM 1 Gender: Female Note Status: Finalized Procedure:            Colonoscopy Indications:          Personal history of colonic polyps Providers:            Lollie Sails, MD Referring MD:         Leonie Douglas. Doy Hutching, MD (Referring MD) Medicines:            Monitored Anesthesia Care Complications:        No immediate complications. Procedure:            Pre-Anesthesia Assessment:                       - ASA Grade Assessment: II - A patient with mild                        systemic disease.                       After obtaining informed consent, the colonoscope was                        passed under direct vision. Throughout the procedure,                        the patient's blood pressure, pulse, and oxygen                        saturations were monitored continuously. The                        Colonoscope was introduced through the anus and                        advanced to the the terminal ileum. The colonoscopy was                        performed with moderate difficulty due to a tortuous                        colon. Successful completion of the procedure was aided                        by changing the patient to a supine position, changing                        the patient to a prone position and using manual                        pressure. The patient tolerated the procedure well. The                        quality of the bowel preparation was good. Findings:      Multiple medium-mouthed diverticula were found in the sigmoid colon.      A 4 mm polyp was found in the splenic flexure. The polyp was sessile.  The polyp was removed with a cold snare. Resection and retrieval were       complete.      A 2 mm polyp was found in the descending  colon. The polyp was sessile.       The polyp was removed with a cold biopsy forceps. Resection and       retrieval were complete.      A 5 mm polyp was found in the sigmoid colon. The polyp was sessile. The       polyp was removed with a cold snare. Resection and retrieval were       complete.      The digital rectal exam was normal. Impression:           - Diverticulosis in the sigmoid colon.                       - One 4 mm polyp at the splenic flexure, removed with a                        cold snare. Resected and retrieved.                       - One 2 mm polyp in the descending colon, removed with                        a cold biopsy forceps. Resected and retrieved.                       - One 5 mm polyp in the sigmoid colon, removed with a                        cold snare. Resected and retrieved. Recommendation:       - Await pathology results.                       - Telephone GI clinic for pathology results in 1 week. Procedure Code(s):    --- Professional ---                       [phone], Colonoscopy, flexible; with removal of tumor(s),                        polyp(s), or other lesion(s) by snare technique                       42595, 89, Colonoscopy, flexible; with biopsy, single                        or multiple Diagnosis Code(s):    --- Professional ---                       D12.3, Benign neoplasm of transverse colon (hepatic                        flexure or splenic flexure)                       D12.4, Benign neoplasm of descending colon  D12.5, Benign neoplasm of sigmoid colon                       Z86.010, Personal history of colonic polyps                       K57.30, Diverticulosis of large intestine without                        perforation or abscess without bleeding CPT copyright 2016 American Medical Association. All rights reserved. The codes documented in this report are preliminary and upon coder review may  be revised to meet current  compliance requirements. Lollie Sails, MD 06/21/2017 8:23:49 AM This report has been signed electronically. Number of Addenda: 0 Note Initiated On: 06/21/2017 7:37 AM Scope Withdrawal Time: 0 hours 15 minutes 31 seconds  Total Procedure Duration: 0 hours 32 minutes 41 seconds       [facility]

## 2017-06-21 NOTE — Anesthesia Post-op Follow-up Note (Signed)
Anesthesia QCDR form completed.        

## 2017-06-22 ENCOUNTER — Encounter: Payer: Self-pay | Admitting: Gastroenterology

## 2017-06-22 LAB — SURGICAL PATHOLOGY

## 2018-01-10 ENCOUNTER — Other Ambulatory Visit: Payer: Self-pay | Admitting: Internal Medicine

## 2018-01-10 DIAGNOSIS — Z1231 Encounter for screening mammogram for malignant neoplasm of breast: Secondary | ICD-10-CM

## 2018-02-18 ENCOUNTER — Ambulatory Visit
Admission: RE | Admit: 2018-02-18 | Discharge: 2018-02-18 | Disposition: A | Discharge status: Home / Self Care | Payer: Medicare Other | Source: Ambulatory Visit | Attending: Internal Medicine | Admitting: Internal Medicine

## 2018-02-18 DIAGNOSIS — Z1231 Encounter for screening mammogram for malignant neoplasm of breast: Secondary | ICD-10-CM | POA: Diagnosis not present

## 2018-05-24 IMAGING — US US EXTREM LOW VENOUS*L*
1 series · 13 of 24 positions shown · non-contrast
Comparison: None.

CLINICAL DATA: 65-year-old female with left knee pain for 2 months.
Initial encounter.

EXAM:
Left LOWER EXTREMITY VENOUS DOPPLER ULTRASOUND
TECHNIQUE: Gray-scale sonography with graded compression, as well as color
Doppler and duplex ultrasound were performed to evaluate the lower
extremity deep venous systems from the level of the common femoral
vein and including the common femoral, femoral, profunda femoral,
popliteal and calf veins including the posterior tibial, peroneal
and gastrocnemius veins when visible. Spectral Doppler was utilized
to evaluate flow at rest and with distal augmentation maneuvers in
the common femoral, femoral and popliteal veins.

[Series 1: us extrem low venous*left* · 0.10mm/px · 13 of 51 slices shown]
[im 1/51]
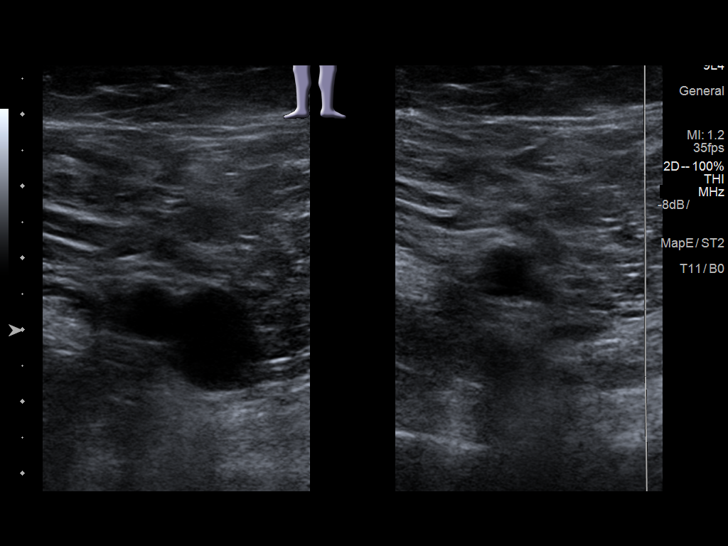
[im 5/51]
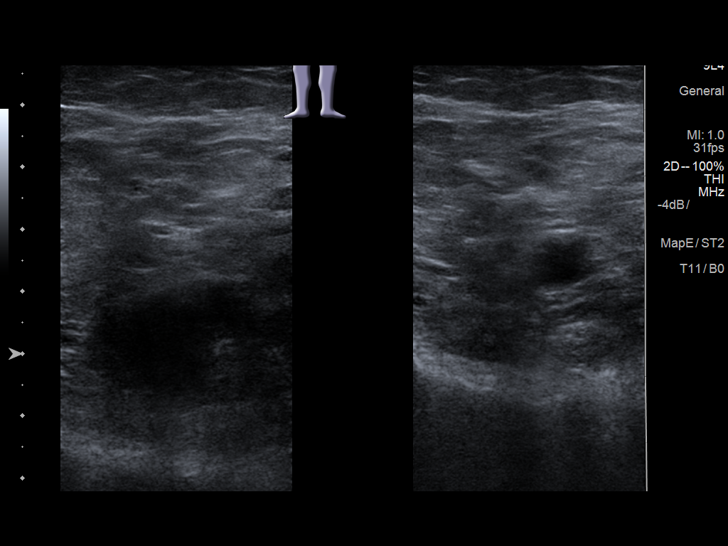
[im 9/51]
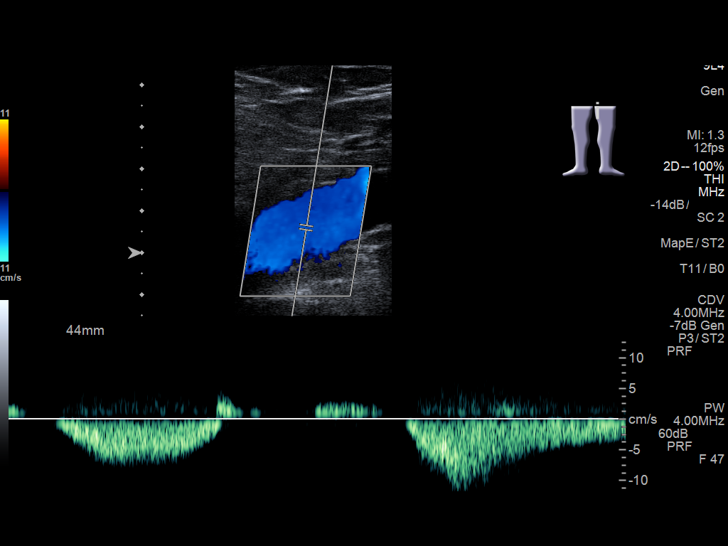
[im 14/51]
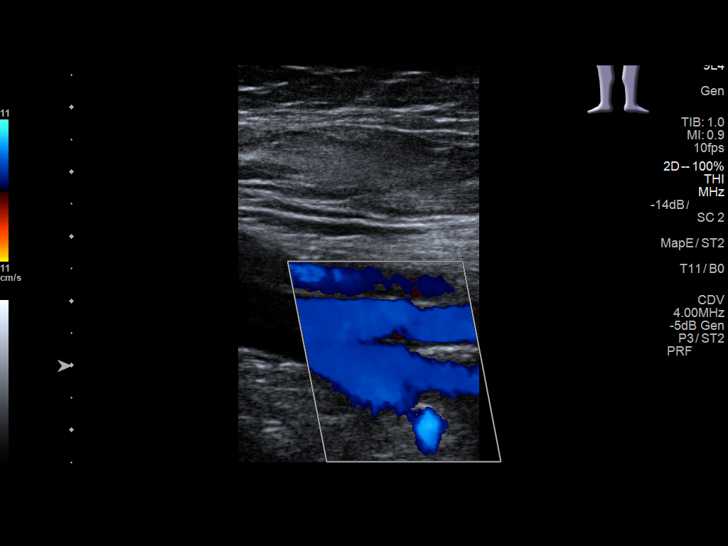
[im 18/51]
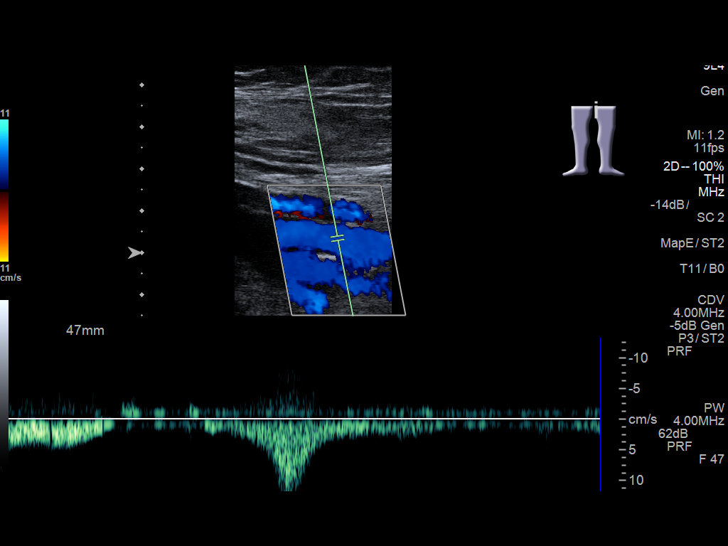
[im 22/51]
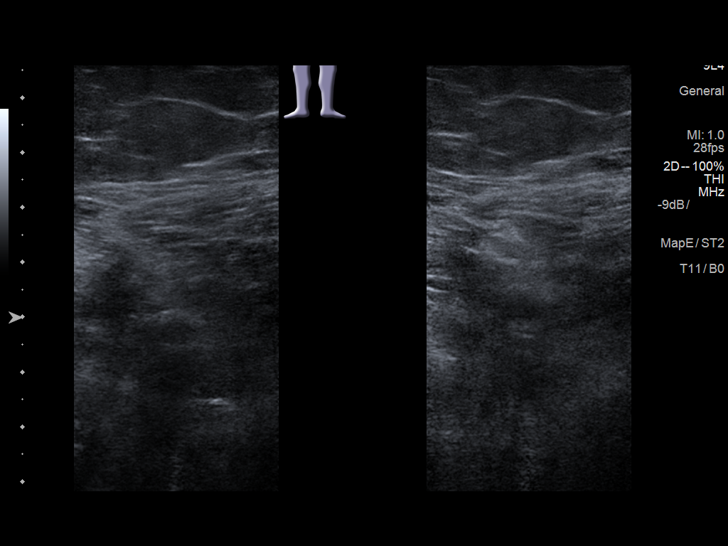
[im 27/51]
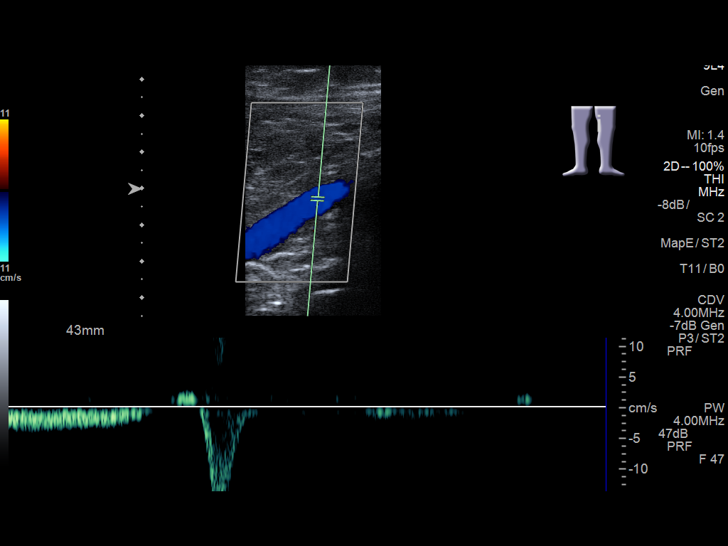
[im 29/51]
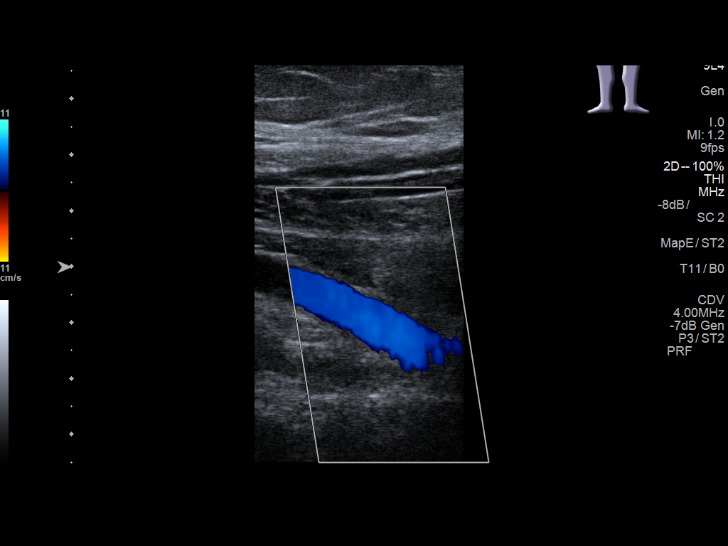
[im 33/51]
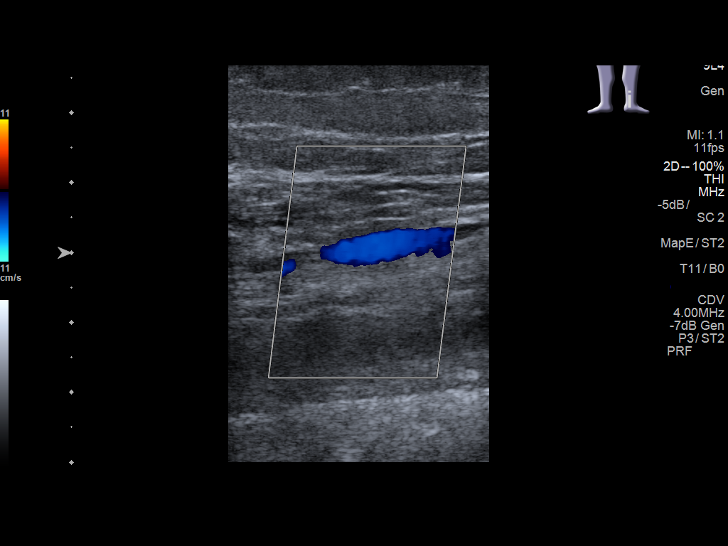
[im 37/51]
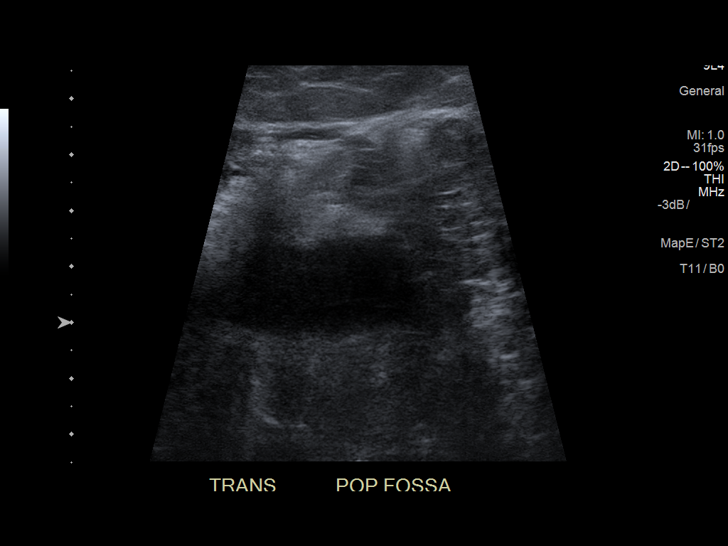
[im 42/51]
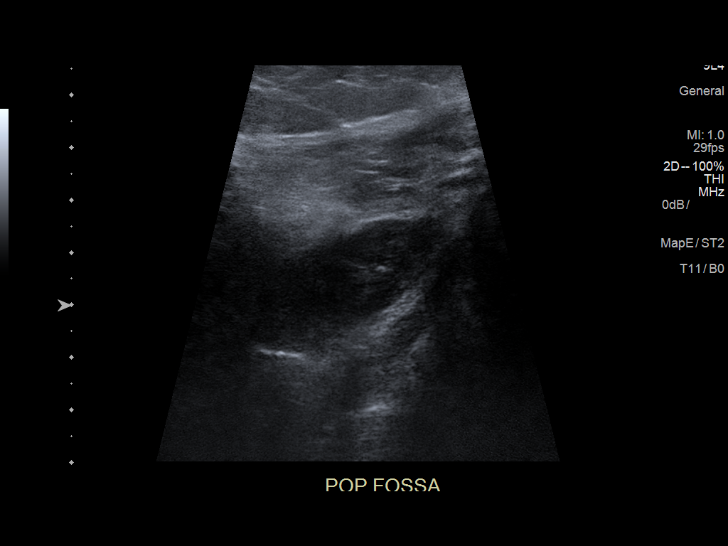
[im 46/51]
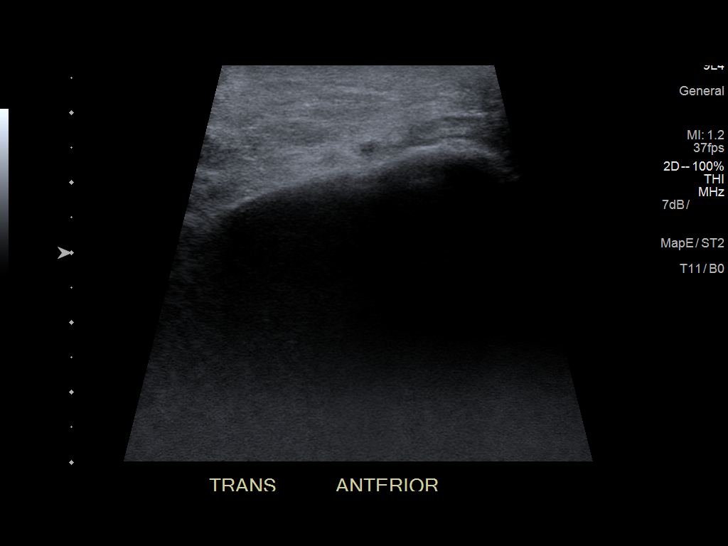
[im 51/51]
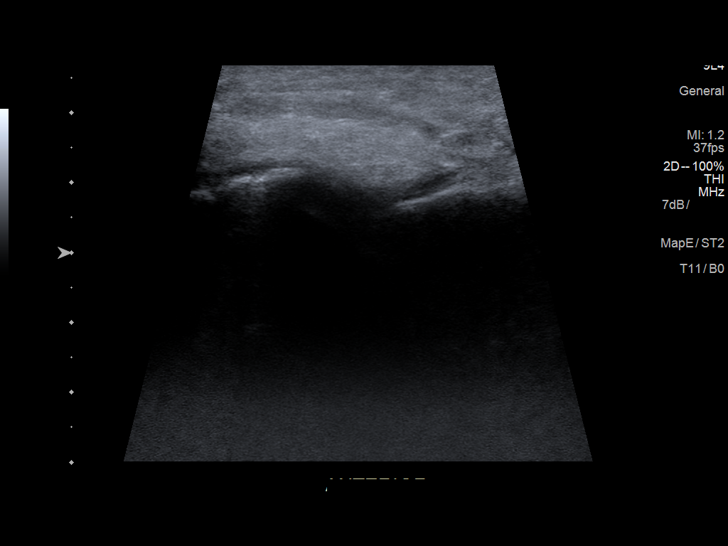

[13 of 24 positions shown; findings below may reference images not displayed]

FINDINGS: Contralateral Common Femoral Vein: Respiratory phasicity is normal
and symmetric with the symptomatic side. No evidence of thrombus.
Normal compressibility.

Common Femoral Vein: No evidence of thrombus. Normal
compressibility, respiratory phasicity and response to augmentation.

Saphenofemoral Junction: No evidence of thrombus. Normal
compressibility and flow on color Doppler imaging.

Profunda Femoral Vein: No evidence of thrombus. Normal
compressibility and flow on color Doppler imaging.

Femoral Vein: No evidence of thrombus. Normal compressibility,
respiratory phasicity and response to augmentation.

Popliteal Vein: No evidence of thrombus. Normal compressibility,
respiratory phasicity and response to augmentation.

Calf Veins: No evidence of thrombus. Normal compressibility and flow
on color Doppler imaging.

Other Findings: 4.3 x 1.8 x 3.7 cm slightly complex fluid structure
popliteal fossa suggestive of Baker's cyst.
IMPRESSION: No evidence of DVT within the left lower extremity.

4.3 x 1.8 x 3.7 cm slightly complex fluid structure left popliteal
fossa suggestive of Baker's cyst.

## 2019-06-19 ENCOUNTER — Other Ambulatory Visit: Payer: Self-pay | Admitting: Internal Medicine

## 2019-06-19 DIAGNOSIS — Z1231 Encounter for screening mammogram for malignant neoplasm of breast: Secondary | ICD-10-CM

## 2019-09-04 ENCOUNTER — Other Ambulatory Visit: Payer: Self-pay | Admitting: Orthopedic Surgery

## 2019-09-04 DIAGNOSIS — S83231A Complex tear of medial meniscus, current injury, right knee, initial encounter: Secondary | ICD-10-CM

## 2019-09-12 ENCOUNTER — Ambulatory Visit
Admission: RE | Admit: 2019-09-12 | Discharge: 2019-09-12 | Disposition: A | Discharge status: Home / Self Care | Payer: Medicare Other | Source: Ambulatory Visit | Attending: Orthopedic Surgery | Admitting: Orthopedic Surgery

## 2019-09-12 ENCOUNTER — Other Ambulatory Visit: Payer: Self-pay

## 2019-09-12 DIAGNOSIS — S83231A Complex tear of medial meniscus, current injury, right knee, initial encounter: Secondary | ICD-10-CM | POA: Insufficient documentation

## 2019-09-13 ENCOUNTER — Ambulatory Visit
Admission: RE | Admit: 2019-09-13 | Discharge: 2019-09-13 | Disposition: A | Discharge status: Home / Self Care | Payer: Medicare Other | Source: Ambulatory Visit | Attending: Internal Medicine | Admitting: Internal Medicine

## 2019-09-13 DIAGNOSIS — Z1231 Encounter for screening mammogram for malignant neoplasm of breast: Secondary | ICD-10-CM | POA: Diagnosis present

## 2019-11-29 ENCOUNTER — Other Ambulatory Visit: Payer: Self-pay | Admitting: Neurosurgery

## 2019-12-14 ENCOUNTER — Other Ambulatory Visit: Payer: Self-pay | Admitting: Neurosurgery

## 2019-12-21 NOTE — Pre-Procedure Instructions (Signed)
Christina Sullivan  12/21/2019                Your procedure is scheduled on Monday, April 26..  Report to [facility], Main Entrance or Entrance "A" at 5:30 AM                Your surgery or procedure is scheduled for 7:30 AM  Call this number if you have problems the morning of surgery: [phone]  This is the number for the Pre- Surgical Desk.   Remember:  Do not eat or drink after midnight Sunday.   Take these medicines the morning of surgery with A SIP OF WATER :  levothyroxine (SYNTHROID)              atorvastatin (LIPITOR)             sertraline (ZOLOFT)   Follow your surgeon's instructions regarding holding or continuing Aspirin.  1 Week prior to surgery STOP taking  Aspirin Products (Goody Powder, Excedrin Migraine), Ibuprofen (Advil), Naproxen (Aleve), Vitamins and Herbal Products (ie Fish Oil).  Special instructions:  - Preparing For Surgery  Before surgery, you can play an important role. Because skin is not sterile, your skin needs to be as free of germs as possible. You can reduce the number of germs on your skin by washing with CHG (chlorahexidine gluconate) Soap before surgery.  CHG is an antiseptic cleaner which kills germs and bonds with the skin to continue killing germs even after washing.    Oral Hygiene is also important to reduce your risk of infection.  Remember - BRUSH YOUR TEETH THE MORNING OF SURGERY WITH YOUR REGULAR TOOTHPASTE  Please do not use if you have an allergy to CHG or antibacterial soaps. If your skin becomes reddened/irritated stop using the CHG.  Do not shave (including legs and underarms) for at least 48 hours prior to first CHG shower. It is OK to shave your face.  Please follow these instructions carefully.   1. Shower the NIGHT BEFORE SURGERY and the MORNING OF SURGERY with CHG.   2. If you chose to wash your hair, wash your hair first as usual with your normal shampoo.  3. After you shampoo, wash your face and  private area with the soap you use at home, then rinse your hair and body thoroughly to remove the shampoo and soap.rinse your hair and body thoroughly to remove the shampoo.  4. Use CHG as you would any other liquid soap. You can apply CHG directly to the skin and wash gently with a scrungie or a clean washcloth.   5. Apply the CHG Soap to your body ONLY FROM THE NECK DOWN.  Do not use on open wounds or open sores. Avoid contact with your eyes, ears, mouth and genitals (private parts).           Wash thoroughly, paying special attention to the area where your surgery will be performed.  6. Thoroughly rinse your body with warm water from the neck down.  7. DO NOT shower/wash with your normal soap after using and rinsing off the CHG Soap.  8. Pat yourself dry with a CLEAN TOWEL.  9. Wear CLEAN PAJAMAS to bed the night before surgery, wear comfortable clothes the morning of surgery  10. Place CLEAN SHEETS on your bed the night of your first shower and DO NOT SLEEP WITH PETS.    Day of Surgery: Shower as instructed above. Do not wear  lotions, powders, or perfumes, or deodorant. Please wear clean clothes to the hospital/surgery center.   Remember to brush your teeth WITH YOUR REGULAR TOOTHPASTE. Do not wear jewelry or makeup.  Do not shave 48 hours prior to surgery.    Do not bring valuables to the hospital.  [facility] is not responsible for any belongings or valuables.  Contacts, dentures or bridgework may not be worn into surgery.  Leave your suitcase in the car.  After surgery it may be brought to your room.  For patients admitted to the hospital, discharge time will be determined by your treatment team.  Patients discharged the day of surgery will not be allowed to drive home.   Please read over the following fact sheets that you were given. Coughing and Deep Breathing, Pain Booklet and Surgical Site Infections

## 2019-12-22 ENCOUNTER — Other Ambulatory Visit (HOSPITAL_COMMUNITY)
Admission: RE | Admit: 2019-12-22 | Discharge: 2019-12-22 | Disposition: A | Discharge status: Home / Self Care | Payer: Medicare Other | Source: Ambulatory Visit | Attending: Neurosurgery | Admitting: Neurosurgery

## 2019-12-22 ENCOUNTER — Encounter (HOSPITAL_COMMUNITY): Payer: Self-pay

## 2019-12-22 ENCOUNTER — Other Ambulatory Visit: Payer: Self-pay

## 2019-12-22 ENCOUNTER — Encounter (HOSPITAL_COMMUNITY)
Admission: RE | Admit: 2019-12-22 | Discharge: 2019-12-22 | Disposition: A | Discharge status: Home / Self Care | Payer: Medicare Other | Source: Ambulatory Visit

## 2019-12-22 DIAGNOSIS — I10 Essential (primary) hypertension: Secondary | ICD-10-CM | POA: Insufficient documentation

## 2019-12-22 DIAGNOSIS — Z20822 Contact with and (suspected) exposure to covid-19: Secondary | ICD-10-CM | POA: Insufficient documentation

## 2019-12-22 DIAGNOSIS — Z01818 Encounter for other preprocedural examination: Secondary | ICD-10-CM | POA: Insufficient documentation

## 2019-12-22 HISTORY — DX: Palpitations: R00.2

## 2019-12-22 HISTORY — DX: Unspecified osteoarthritis, unspecified site: M19.90

## 2019-12-22 HISTORY — DX: Unspecified malignant neoplasm of skin, unspecified: C44.90

## 2019-12-22 HISTORY — DX: Hypothyroidism, unspecified: E03.9

## 2019-12-22 LAB — CBC
HCT: 49.6 % — ABNORMAL HIGH (ref 36.0–46.0)
Hemoglobin: 16.5 g/dL — ABNORMAL HIGH (ref 12.0–15.0)
MCH: 30.6 pg (ref 26.0–34.0)
MCHC: 33.3 g/dL (ref 30.0–36.0)
MCV: 92 fL (ref 80.0–100.0)
Platelets: 354 10*3/uL (ref 150–400)
RBC: 5.39 MIL/uL — ABNORMAL HIGH (ref 3.87–5.11)
RDW: 12.7 % (ref 11.5–15.5)
WBC: 9.6 10*3/uL (ref 4.0–10.5)
nRBC: 0 % (ref 0.0–0.2)

## 2019-12-22 LAB — BASIC METABOLIC PANEL
Anion gap: 12 (ref 5–15)
BUN: 21 mg/dL (ref 8–23)
CO2: 28 mmol/L (ref 22–32)
Calcium: 9.8 mg/dL (ref 8.9–10.3)
Chloride: 101 mmol/L (ref 98–111)
Creatinine, Ser: 1.09 mg/dL — ABNORMAL HIGH (ref 0.44–1.00)
GFR calc Af Amer: >60 mL/min (ref >60)
GFR calc non Af Amer: 52 mL/min — ABNORMAL LOW (ref >60)
Glucose, Bld: 113 mg/dL — ABNORMAL HIGH (ref 70–99)
Potassium: 4 mmol/L (ref 3.5–5.1)
Sodium: 141 mmol/L (ref 135–145)

## 2019-12-22 LAB — TYPE AND SCREEN
ABO/RH(D): A POS
Antibody Screen: NEGATIVE

## 2019-12-22 LAB — ABO/RH: ABO/RH(D): A POS

## 2019-12-22 LAB — SARS CORONAVIRUS 2 (TAT 6-24 HRS): SARS Coronavirus 2: NEGATIVE

## 2019-12-22 LAB — SURGICAL PCR SCREEN
MRSA, PCR: NEGATIVE
Staphylococcus aureus: NEGATIVE

## 2019-12-22 MED ORDER — DEXTROSE 5 % IV SOLN
3.0000 g | INTRAVENOUS | Status: AC
  Administered 2019-12-25: 3 g via INTRAVENOUS
  Filled 2019-12-22: qty 3
  Filled 2019-12-22: qty 3000

## 2019-12-22 NOTE — Progress Notes (Signed)
Anesthesia Chart Review:  Previous cardiac eval for palpitations. Seen by Dr. Agustin Cree 02/09/18. She had a normal stress test and normal echo. Per note, "no further interventions of palpitations which appear to be benign in nature and stable at this time. We have discussed the treatment options including diet and exercise and treatment of other lifestyle measures which may reduce symptoms. Further consideration medical management as needed for future symptom relief."  Due to her awareness of her PVCs, she was subsequently prescribed beta blocker by her PCP but discontinued due to side effects.  EKG at PAT showed NSR rate 68.  Proep labs reviewed, unremarkable.   TTE 02/09/18 (care everywhere): INTERPRETATION NORMAL LEFT VENTRICULAR SYSTOLIC FUNCTION NORMAL RIGHT VENTRICULAR SYSTOLIC FUNCTION MILD MR, MILD TR NO VALVULAR STENOSIS  Exercise stress test 02/09/18 (care everywhere, per cardiology note 02/09/18): Regular Stress was performed showing Normal test.  Event monitor 02/07/18: Baseline normal sinus rhythm with maximum heart rate of 105 bpm minimum of 69 beats per an average of 54 bpm. There are occasional pre-atrial and preventricular contractions. Heart rate is variable depending on activity levels but no evidence of advanced heart block symptomatic bradycardia or supraventricular tachycardia. There was no diary entry   Wynonia Musty [facility] Short Stay Center/Anesthesiology Phone [phone] 12/22/2019 2:35 PM

## 2019-12-22 NOTE — Anesthesia Preprocedure Evaluation (Addendum)
Anesthesia Evaluation  Patient identified by MRN, date of birth, ID band Patient awake    Reviewed: Allergy & Precautions, H&P , NPO status , Patient's Chart, lab work & pertinent test results  History of Anesthesia Complications (+) PONV  Airway Mallampati: III  TM Distance: >3 FB Neck ROM: Full    Dental no notable dental hx. (+) Teeth Intact, Dental Advisory Given   Pulmonary neg pulmonary ROS,    Pulmonary exam normal breath sounds clear to auscultation       Cardiovascular Exercise Tolerance: Good hypertension, Pt. on medications  Rhythm:Regular Rate:Normal     Neuro/Psych Anxiety Depression negative neurological ROS     GI/Hepatic Neg liver ROS, PUD,   Endo/Other  Hypothyroidism Morbid obesity  Renal/GU negative Renal ROS  negative genitourinary   Musculoskeletal  (+) Arthritis ,   Abdominal   Peds  Hematology negative hematology ROS (+)   Anesthesia Other Findings   Reproductive/Obstetrics negative OB ROS                          Anesthesia Physical Anesthesia Plan  ASA: III  Anesthesia Plan: General   Post-op Pain Management:    Induction: Intravenous  PONV Risk Score and Plan: 4 or greater and Ondansetron, Dexamethasone and Midazolam  Airway Management Planned: Oral ETT  Additional Equipment:   Intra-op Plan:   Post-operative Plan: Extubation in OR  Informed Consent: I have reviewed the patients History and Physical, chart, labs and discussed the procedure including the risks, benefits and alternatives for the proposed anesthesia with the patient or authorized representative who has indicated his/her understanding and acceptance.     Dental advisory given  Plan Discussed with: CRNA  Anesthesia Plan Comments: (Previous cardiac eval for palpitations. Seen by Dr. Agustin Cree 02/09/18. She had a normal stress test and normal echo. Per note, "no further interventions  of palpitations which appear to be benign in nature and stable at this time. We have discussed the treatment options including diet and exercise and treatment of other lifestyle measures which may reduce symptoms. Further consideration medical management as needed for future symptom relief."  Due to her awareness of her PVCs, she was subsequently prescribed beta blocker by her PCP but discontinued due to side effects.  EKG at PAT showed NSR rate 68.  Proep labs reviewed, unremarkable.   TTE 02/09/18 (care everywhere): INTERPRETATION NORMAL LEFT VENTRICULAR SYSTOLIC FUNCTION NORMAL RIGHT VENTRICULAR SYSTOLIC FUNCTION MILD MR, MILD TR NO VALVULAR STENOSIS  Exercise stress test 02/09/18 (care everywhere, per cardiology note 02/09/18): Regular Stress was performed showing Normal test.  Event monitor 02/07/18: Baseline normal sinus rhythm with maximum heart rate of 105 bpm minimum of 69 beats per an average of 54 bpm. There are occasional pre-atrial and preventricular contractions. Heart rate is variable depending on activity levels but no evidence of advanced heart block symptomatic bradycardia or supraventricular tachycardia. There was no diary entry )      Anesthesia Quick Evaluation

## 2019-12-22 NOTE — Progress Notes (Addendum)
PCP - Dr. Earnest Bailey  Cardiologist - saw Dr. Nehemiah Massed in 2019 patient had palpations- patient to follow up if needed.  Chest x-ray - na  EKG - 12/22/2019  Stress Test - 2019  ECHO - 2019  Cardiac Cath - no   Sleep Study - no CPAP - no  LABS-PCR, CBC, BMP, T/S  ASA-Last dose 12/19/2019 ERAS-no  HA1C-na Fasting Blood Sugar - na Checks Blood Sugar _0  Anesthesia-  Christina Sullivan has a history of palpations, "for over 4 years"; patient was sent to be evaluated by Cardiologist, Dr. Lowella Petties did not find any cardaic  Issues, he thought it may be from Hypothyroidism. Synthroid has bee increase, patient is not having palpations as frequently.  Pt denies having chest pain, sob, or fever at this time. All instructions explained to the pt, with a verbal understanding of the material. Pt agrees to go over the instructions while at home for a better understanding. Pt also instructed to self quarantine after being tested for COVID-19. The opportunity to ask questions was provided.

## 2019-12-25 ENCOUNTER — Inpatient Hospital Stay (HOSPITAL_COMMUNITY): Payer: Medicare Other | Admitting: Physician Assistant

## 2019-12-25 ENCOUNTER — Inpatient Hospital Stay (HOSPITAL_COMMUNITY): Payer: Medicare Other | Admitting: Anesthesiology

## 2019-12-25 ENCOUNTER — Other Ambulatory Visit: Payer: Self-pay

## 2019-12-25 ENCOUNTER — Encounter (HOSPITAL_COMMUNITY)
Admission: RE | Disposition: A | Discharge status: Home / Self Care | Payer: Self-pay | Source: Home / Self Care | Attending: Neurosurgery

## 2019-12-25 ENCOUNTER — Inpatient Hospital Stay (HOSPITAL_COMMUNITY)
Admission: RE | Admit: 2019-12-25 | Discharge: 2019-12-26 | DRG: 473 | Disposition: A | Discharge status: Home / Self Care | Payer: Medicare Other | Attending: Neurosurgery | Admitting: Neurosurgery

## 2019-12-25 ENCOUNTER — Inpatient Hospital Stay (HOSPITAL_COMMUNITY): Payer: Medicare Other

## 2019-12-25 ENCOUNTER — Encounter (HOSPITAL_COMMUNITY): Payer: Self-pay | Admitting: Neurosurgery

## 2019-12-25 DIAGNOSIS — Z9842 Cataract extraction status, left eye: Secondary | ICD-10-CM

## 2019-12-25 DIAGNOSIS — F419 Anxiety disorder, unspecified: Secondary | ICD-10-CM | POA: Diagnosis present

## 2019-12-25 DIAGNOSIS — Z9841 Cataract extraction status, right eye: Secondary | ICD-10-CM | POA: Diagnosis not present

## 2019-12-25 DIAGNOSIS — Z79899 Other long term (current) drug therapy: Secondary | ICD-10-CM | POA: Diagnosis not present

## 2019-12-25 DIAGNOSIS — F329 Major depressive disorder, single episode, unspecified: Secondary | ICD-10-CM | POA: Diagnosis present

## 2019-12-25 DIAGNOSIS — E785 Hyperlipidemia, unspecified: Secondary | ICD-10-CM | POA: Diagnosis present

## 2019-12-25 DIAGNOSIS — I10 Essential (primary) hypertension: Secondary | ICD-10-CM | POA: Diagnosis present

## 2019-12-25 DIAGNOSIS — M542 Cervicalgia: Secondary | ICD-10-CM | POA: Diagnosis present

## 2019-12-25 DIAGNOSIS — Y838 Other surgical procedures as the cause of abnormal reaction of the patient, or of later complication, without mention of misadventure at the time of the procedure: Secondary | ICD-10-CM | POA: Diagnosis present

## 2019-12-25 DIAGNOSIS — M1990 Unspecified osteoarthritis, unspecified site: Secondary | ICD-10-CM | POA: Diagnosis present

## 2019-12-25 DIAGNOSIS — Z419 Encounter for procedure for purposes other than remedying health state, unspecified: Secondary | ICD-10-CM

## 2019-12-25 DIAGNOSIS — Z8543 Personal history of malignant neoplasm of ovary: Secondary | ICD-10-CM | POA: Diagnosis not present

## 2019-12-25 DIAGNOSIS — Z79890 Hormone replacement therapy (postmenopausal): Secondary | ICD-10-CM | POA: Diagnosis not present

## 2019-12-25 DIAGNOSIS — Z85828 Personal history of other malignant neoplasm of skin: Secondary | ICD-10-CM | POA: Diagnosis not present

## 2019-12-25 DIAGNOSIS — S129XXA Fracture of neck, unspecified, initial encounter: Secondary | ICD-10-CM | POA: Diagnosis present

## 2019-12-25 DIAGNOSIS — Z961 Presence of intraocular lens: Secondary | ICD-10-CM | POA: Diagnosis present

## 2019-12-25 DIAGNOSIS — Z20822 Contact with and (suspected) exposure to covid-19: Secondary | ICD-10-CM | POA: Diagnosis present

## 2019-12-25 DIAGNOSIS — E039 Hypothyroidism, unspecified: Secondary | ICD-10-CM | POA: Diagnosis present

## 2019-12-25 DIAGNOSIS — M960 Pseudarthrosis after fusion or arthrodesis: Principal | ICD-10-CM | POA: Diagnosis present

## 2019-12-25 HISTORY — PX: POSTERIOR CERVICAL FUSION/FORAMINOTOMY: SHX5038

## 2019-12-25 SURGERY — POSTERIOR CERVICAL FUSION/FORAMINOTOMY LEVEL 1
Anesthesia: General | Site: Spine Cervical

## 2019-12-25 MED ORDER — LIDOCAINE-EPINEPHRINE 1 %-1:100000 IJ SOLN
INTRAMUSCULAR | Status: AC
  Filled 2019-12-25: qty 1

## 2019-12-25 MED ORDER — THROMBIN 5000 UNITS EX SOLR
CUTANEOUS | Status: AC
  Filled 2019-12-25: qty 5000

## 2019-12-25 MED ORDER — TRIAMTERENE-HCTZ 37.5-25 MG PO TABS
1.0000 | ORAL_TABLET | Freq: Every day | ORAL | Status: DC
  Administered 2019-12-25 – 2019-12-26 (×2): 1 via ORAL
  Filled 2019-12-25 (×2): qty 1

## 2019-12-25 MED ORDER — CYCLOBENZAPRINE HCL 10 MG PO TABS
ORAL_TABLET | ORAL | Status: AC
  Filled 2019-12-25: qty 1

## 2019-12-25 MED ORDER — SODIUM CHLORIDE 0.9 % IV SOLN
INTRAVENOUS | Status: DC | PRN
  Administered 2019-12-25: 09:00:00 500 mL

## 2019-12-25 MED ORDER — LEVOTHYROXINE SODIUM 75 MCG PO TABS
75.0000 ug | ORAL_TABLET | Freq: Every day | ORAL | Status: DC
  Administered 2019-12-26: 05:00:00 75 ug via ORAL
  Filled 2019-12-25: qty 1

## 2019-12-25 MED ORDER — OXYCODONE HCL 5 MG PO TABS
ORAL_TABLET | ORAL | Status: AC
  Filled 2019-12-25: qty 1

## 2019-12-25 MED ORDER — ATORVASTATIN CALCIUM 10 MG PO TABS
10.0000 mg | ORAL_TABLET | Freq: Every day | ORAL | Status: DC
  Administered 2019-12-25: 19:00:00 10 mg via ORAL
  Filled 2019-12-25: qty 1

## 2019-12-25 MED ORDER — ZOLPIDEM TARTRATE 5 MG PO TABS: 5.0000 mg | ORAL_TABLET | Freq: Every evening | ORAL | Status: DC | PRN

## 2019-12-25 MED ORDER — FENTANYL CITRATE (PF) 100 MCG/2ML IJ SOLN
INTRAMUSCULAR | Status: DC | PRN
  Administered 2019-12-25: 100 ug via INTRAVENOUS
  Administered 2019-12-25 (×2): 50 ug via INTRAVENOUS
  Administered 2019-12-25: 100 ug via INTRAVENOUS

## 2019-12-25 MED ORDER — MENTHOL 3 MG MT LOZG: 1.0000 | LOZENGE | OROMUCOSAL | Status: DC | PRN

## 2019-12-25 MED ORDER — THROMBIN 5000 UNITS EX SOLR
OROMUCOSAL | Status: DC | PRN
  Administered 2019-12-25: 5 mL via TOPICAL

## 2019-12-25 MED ORDER — LIDOCAINE 2% (20 MG/ML) 5 ML SYRINGE
INTRAMUSCULAR | Status: AC
  Filled 2019-12-25: qty 5

## 2019-12-25 MED ORDER — BISACODYL 10 MG RE SUPP: 10.0000 mg | Freq: Every day | RECTAL | Status: DC | PRN

## 2019-12-25 MED ORDER — LIDOCAINE-EPINEPHRINE 1 %-1:100000 IJ SOLN
INTRAMUSCULAR | Status: DC | PRN
  Administered 2019-12-25: 10 mL

## 2019-12-25 MED ORDER — PHENYLEPHRINE 40 MCG/ML (10ML) SYRINGE FOR IV PUSH (FOR BLOOD PRESSURE SUPPORT)
PREFILLED_SYRINGE | INTRAVENOUS | Status: DC | PRN
  Administered 2019-12-25: 80 ug via INTRAVENOUS
  Administered 2019-12-25: 120 ug via INTRAVENOUS

## 2019-12-25 MED ORDER — DOCUSATE SODIUM 100 MG PO CAPS
100.0000 mg | ORAL_CAPSULE | Freq: Two times a day (BID) | ORAL | Status: DC
  Administered 2019-12-25 – 2019-12-26 (×2): 100 mg via ORAL
  Filled 2019-12-25 (×2): qty 1

## 2019-12-25 MED ORDER — DEXAMETHASONE SODIUM PHOSPHATE 10 MG/ML IJ SOLN
INTRAMUSCULAR | Status: AC
  Filled 2019-12-25: qty 1

## 2019-12-25 MED ORDER — PROPOFOL 10 MG/ML IV BOLUS
INTRAVENOUS | Status: DC | PRN
  Administered 2019-12-25: 130 mg via INTRAVENOUS

## 2019-12-25 MED ORDER — OXYCODONE HCL 5 MG PO TABS
10.0000 mg | ORAL_TABLET | ORAL | Status: DC | PRN
  Administered 2019-12-25 – 2019-12-26 (×4): 10 mg via ORAL
  Filled 2019-12-25 (×4): qty 2

## 2019-12-25 MED ORDER — BACITRACIN ZINC 500 UNIT/GM EX OINT
TOPICAL_OINTMENT | CUTANEOUS | Status: AC
  Filled 2019-12-25: qty 28.35

## 2019-12-25 MED ORDER — BACITRACIN 500 UNIT/GM EX OINT
TOPICAL_OINTMENT | CUTANEOUS | Status: DC | PRN
  Administered 2019-12-25: 1 via TOPICAL

## 2019-12-25 MED ORDER — MIDAZOLAM HCL 2 MG/2ML IJ SOLN
INTRAMUSCULAR | Status: AC
  Filled 2019-12-25: qty 2

## 2019-12-25 MED ORDER — ONDANSETRON HCL 4 MG/2ML IJ SOLN
INTRAMUSCULAR | Status: DC | PRN
  Administered 2019-12-25: 4 mg via INTRAVENOUS

## 2019-12-25 MED ORDER — BUPIVACAINE LIPOSOME 1.3 % IJ SUSP
INTRAMUSCULAR | Status: DC | PRN
  Administered 2019-12-25: 20 mL

## 2019-12-25 MED ORDER — LIDOCAINE 2% (20 MG/ML) 5 ML SYRINGE
INTRAMUSCULAR | Status: DC | PRN
  Administered 2019-12-25: 60 mg via INTRAVENOUS

## 2019-12-25 MED ORDER — MIDAZOLAM HCL 5 MG/5ML IJ SOLN
INTRAMUSCULAR | Status: DC | PRN
  Administered 2019-12-25: 2 mg via INTRAVENOUS

## 2019-12-25 MED ORDER — PHENYLEPHRINE HCL (PRESSORS) 10 MG/ML IV SOLN
INTRAVENOUS | Status: AC
  Filled 2019-12-25: qty 1

## 2019-12-25 MED ORDER — ONDANSETRON HCL 4 MG/2ML IJ SOLN
INTRAMUSCULAR | Status: AC
  Filled 2019-12-25: qty 2

## 2019-12-25 MED ORDER — HYDROMORPHONE HCL 1 MG/ML IJ SOLN: 0.2500 mg | INTRAMUSCULAR | Status: DC | PRN

## 2019-12-25 MED ORDER — 0.9 % SODIUM CHLORIDE (POUR BTL) OPTIME
TOPICAL | Status: DC | PRN
  Administered 2019-12-25: 1000 mL

## 2019-12-25 MED ORDER — ACETAMINOPHEN 500 MG PO TABS
1000.0000 mg | ORAL_TABLET | Freq: Once | ORAL | Status: AC
  Administered 2019-12-25: 06:00:00 1000 mg via ORAL
  Filled 2019-12-25: qty 2

## 2019-12-25 MED ORDER — PHENOL 1.4 % MT LIQD: 1.0000 | OROMUCOSAL | Status: DC | PRN

## 2019-12-25 MED ORDER — CEFAZOLIN SODIUM 1 G IJ SOLR
INTRAMUSCULAR | Status: AC
  Filled 2019-12-25: qty 30

## 2019-12-25 MED ORDER — FENTANYL CITRATE (PF) 250 MCG/5ML IJ SOLN
INTRAMUSCULAR | Status: AC
  Filled 2019-12-25: qty 5

## 2019-12-25 MED ORDER — SERTRALINE HCL 50 MG PO TABS
50.0000 mg | ORAL_TABLET | Freq: Every day | ORAL | Status: DC
  Administered 2019-12-25 – 2019-12-26 (×2): 50 mg via ORAL
  Filled 2019-12-25 (×2): qty 1

## 2019-12-25 MED ORDER — PROPOFOL 10 MG/ML IV BOLUS
INTRAVENOUS | Status: AC
  Filled 2019-12-25: qty 40

## 2019-12-25 MED ORDER — EPHEDRINE 5 MG/ML INJ
INTRAVENOUS | Status: AC
  Filled 2019-12-25: qty 10

## 2019-12-25 MED ORDER — ROCURONIUM BROMIDE 10 MG/ML (PF) SYRINGE
PREFILLED_SYRINGE | INTRAVENOUS | Status: AC
  Filled 2019-12-25: qty 10

## 2019-12-25 MED ORDER — MORPHINE SULFATE (PF) 4 MG/ML IV SOLN: 4.0000 mg | INTRAVENOUS | Status: DC | PRN

## 2019-12-25 MED ORDER — DEXAMETHASONE SODIUM PHOSPHATE 10 MG/ML IJ SOLN
INTRAMUSCULAR | Status: DC | PRN
Start: 2019-12-25 — End: 2019-12-25
  Administered 2019-12-25: 10 mg via INTRAVENOUS

## 2019-12-25 MED ORDER — CYCLOBENZAPRINE HCL 10 MG PO TABS
10.0000 mg | ORAL_TABLET | Freq: Three times a day (TID) | ORAL | Status: DC | PRN
  Administered 2019-12-25 – 2019-12-26 (×3): 10 mg via ORAL
  Filled 2019-12-25 (×2): qty 1

## 2019-12-25 MED ORDER — VITAMIN D3 25 MCG (1000 UNIT) PO TABS
1000.0000 [IU] | ORAL_TABLET | Freq: Every day | ORAL | Status: DC
  Administered 2019-12-26: 10:00:00 1000 [IU] via ORAL
  Filled 2019-12-25: qty 1

## 2019-12-25 MED ORDER — OXYCODONE HCL 5 MG PO TABS
5.0000 mg | ORAL_TABLET | ORAL | Status: DC | PRN
  Administered 2019-12-25: 5 mg via ORAL

## 2019-12-25 MED ORDER — CEFAZOLIN SODIUM-DEXTROSE 2-4 GM/100ML-% IV SOLN
2.0000 g | Freq: Three times a day (TID) | INTRAVENOUS | Status: AC
  Administered 2019-12-25 (×2): 2 g via INTRAVENOUS
  Filled 2019-12-25 (×2): qty 100

## 2019-12-25 MED ORDER — ACETAMINOPHEN 650 MG RE SUPP: 650.0000 mg | RECTAL | Status: DC | PRN

## 2019-12-25 MED ORDER — ONDANSETRON HCL 4 MG/2ML IJ SOLN
4.0000 mg | Freq: Four times a day (QID) | INTRAMUSCULAR | Status: DC | PRN
  Administered 2019-12-25: 11:00:00 4 mg via INTRAVENOUS

## 2019-12-25 MED ORDER — ACETAMINOPHEN 325 MG PO TABS: 650.0000 mg | ORAL_TABLET | ORAL | Status: DC | PRN

## 2019-12-25 MED ORDER — LACTATED RINGERS IV SOLN: INTRAVENOUS | Status: DC | PRN

## 2019-12-25 MED ORDER — ONDANSETRON HCL 4 MG PO TABS: 4.0000 mg | ORAL_TABLET | Freq: Four times a day (QID) | ORAL | Status: DC | PRN

## 2019-12-25 MED ORDER — ALUM & MAG HYDROXIDE-SIMETH 200-200-20 MG/5ML PO SUSP: 30.0000 mL | Freq: Four times a day (QID) | ORAL | Status: DC | PRN

## 2019-12-25 MED ORDER — CELECOXIB 200 MG PO CAPS
200.0000 mg | ORAL_CAPSULE | Freq: Once | ORAL | Status: AC
  Administered 2019-12-25: 06:00:00 200 mg via ORAL
  Filled 2019-12-25: qty 1

## 2019-12-25 MED ORDER — CHLORHEXIDINE GLUCONATE CLOTH 2 % EX PADS: 6.0000 | MEDICATED_PAD | Freq: Once | CUTANEOUS | Status: DC

## 2019-12-25 MED ORDER — ACETAMINOPHEN 500 MG PO TABS
1000.0000 mg | ORAL_TABLET | Freq: Four times a day (QID) | ORAL | Status: AC
  Administered 2019-12-25 – 2019-12-26 (×4): 1000 mg via ORAL
  Filled 2019-12-25 (×4): qty 2

## 2019-12-25 MED ORDER — PHENYLEPHRINE HCL-NACL 10-0.9 MG/250ML-% IV SOLN
INTRAVENOUS | Status: DC | PRN
  Administered 2019-12-25: 50 ug/min via INTRAVENOUS

## 2019-12-25 MED ORDER — EPHEDRINE SULFATE-NACL 50-0.9 MG/10ML-% IV SOSY
PREFILLED_SYRINGE | INTRAVENOUS | Status: DC | PRN
  Administered 2019-12-25: 10 mg via INTRAVENOUS

## 2019-12-25 MED ORDER — LACTATED RINGERS IV SOLN: INTRAVENOUS | Status: DC

## 2019-12-25 MED ORDER — ROCURONIUM BROMIDE 50 MG/5ML IV SOSY
PREFILLED_SYRINGE | INTRAVENOUS | Status: DC | PRN
  Administered 2019-12-25: 30 mg via INTRAVENOUS
  Administered 2019-12-25: 70 mg via INTRAVENOUS

## 2019-12-25 MED ORDER — SUGAMMADEX SODIUM 200 MG/2ML IV SOLN
INTRAVENOUS | Status: DC | PRN
  Administered 2019-12-25: 240 mg via INTRAVENOUS

## 2019-12-25 SURGICAL SUPPLY — 68 items
BAG DECANTER FOR FLEXI CONT (MISCELLANEOUS) ×3 IMPLANT
BENZOIN TINCTURE PRP APPL 2/3 (GAUZE/BANDAGES/DRESSINGS) ×3 IMPLANT
BIT DRILL NEURO 2X3.1 SFT TUCH (MISCELLANEOUS) ×1 IMPLANT
BLADE CLIPPER SURG (BLADE) ×3 IMPLANT
BLADE ULTRA TIP 2M (BLADE) IMPLANT
CANISTER SUCT 3000ML PPV (MISCELLANEOUS) ×3 IMPLANT
CAP CLSR POST CERV (Cap) ×18 IMPLANT
CARTRIDGE OIL MAESTRO DRILL (MISCELLANEOUS) ×1 IMPLANT
CLOSURE WOUND 1/2 X4 (GAUZE/BANDAGES/DRESSINGS) ×1
COVER WAND RF STERILE (DRAPES) IMPLANT
DECANTER SPIKE VIAL GLASS SM (MISCELLANEOUS) ×3 IMPLANT
DIFFUSER DRILL AIR PNEUMATIC (MISCELLANEOUS) ×3 IMPLANT
DRAPE C-ARM 42X72 X-RAY (DRAPES) ×6 IMPLANT
DRAPE LAPAROTOMY 100X72 PEDS (DRAPES) ×3 IMPLANT
DRAPE MICROSCOPE LEICA (MISCELLANEOUS) IMPLANT
DRAPE SURG 17X23 STRL (DRAPES) ×9 IMPLANT
DRILL NEURO 2X3.1 SOFT TOUCH (MISCELLANEOUS) ×3
DRSG OPSITE POSTOP 4X6 (GAUZE/BANDAGES/DRESSINGS) ×3 IMPLANT
ELECT BLADE 4.0 EZ CLEAN MEGAD (MISCELLANEOUS) ×3
ELECT REM PT RETURN 9FT ADLT (ELECTROSURGICAL) ×3
ELECTRODE BLDE 4.0 EZ CLN MEGD (MISCELLANEOUS) ×1 IMPLANT
ELECTRODE REM PT RTRN 9FT ADLT (ELECTROSURGICAL) ×1 IMPLANT
GAUZE 4X4 16PLY RFD (DISPOSABLE) IMPLANT
GAUZE SPONGE 4X4 12PLY STRL (GAUZE/BANDAGES/DRESSINGS) IMPLANT
GLOVE BIO SURGEON STRL SZ 6.5 (GLOVE) ×2 IMPLANT
GLOVE BIO SURGEON STRL SZ7 (GLOVE) ×3 IMPLANT
GLOVE BIO SURGEON STRL SZ8 (GLOVE) ×3 IMPLANT
GLOVE BIO SURGEON STRL SZ8.5 (GLOVE) ×3 IMPLANT
GLOVE BIO SURGEONS STRL SZ 6.5 (GLOVE) ×1
GLOVE BIOGEL PI IND STRL 6.5 (GLOVE) ×2 IMPLANT
GLOVE BIOGEL PI INDICATOR 6.5 (GLOVE) ×4
GLOVE EXAM NITRILE XL STR (GLOVE) IMPLANT
GLOVE SURG SS PI 6.0 STRL IVOR (GLOVE) ×12 IMPLANT
GOWN STRL REUS W/ TWL LRG LVL3 (GOWN DISPOSABLE) ×2 IMPLANT
GOWN STRL REUS W/ TWL XL LVL3 (GOWN DISPOSABLE) ×1 IMPLANT
GOWN STRL REUS W/TWL 2XL LVL3 (GOWN DISPOSABLE) IMPLANT
GOWN STRL REUS W/TWL LRG LVL3 (GOWN DISPOSABLE) ×4
GOWN STRL REUS W/TWL XL LVL3 (GOWN DISPOSABLE) ×2
KIT BASIN OR (CUSTOM PROCEDURE TRAY) ×3 IMPLANT
KIT INFUSE XX SMALL 0.7CC (Orthopedic Implant) ×3 IMPLANT
KIT TURNOVER KIT B (KITS) ×3 IMPLANT
NEEDLE HYPO 21X1.5 SAFETY (NEEDLE) ×3 IMPLANT
NEEDLE HYPO 22GX1.5 SAFETY (NEEDLE) ×3 IMPLANT
NEEDLE SPNL 18GX3.5 QUINCKE PK (NEEDLE) ×3 IMPLANT
NS IRRIG 1000ML POUR BTL (IV SOLUTION) ×3 IMPLANT
OIL CARTRIDGE MAESTRO DRILL (MISCELLANEOUS) ×3
PACK LAMINECTOMY NEURO (CUSTOM PROCEDURE TRAY) ×3 IMPLANT
PAD ARMBOARD 7.5X6 YLW CONV (MISCELLANEOUS) ×9 IMPLANT
PATTIES SURGICAL .25X.25 (GAUZE/BANDAGES/DRESSINGS) IMPLANT
PIN MAYFIELD SKULL DISP (PIN) ×3 IMPLANT
PUTTY DBM 5CC CALC GRAN ×3 IMPLANT
ROD VIRAGE 3.5X35MM STRAIGHT (Cage) ×6 IMPLANT
RUBBERBAND STERILE (MISCELLANEOUS) IMPLANT
SCREW VIRAGE 3.5X14 (Screw) ×18 IMPLANT
SPONGE LAP 4X18 RFD (DISPOSABLE) IMPLANT
SPONGE NEURO XRAY DETECT 1X3 (DISPOSABLE) IMPLANT
SPONGE SURGIFOAM ABS GEL SZ50 (HEMOSTASIS) IMPLANT
STAPLER SKIN PROX WIDE 3.9 (STAPLE) IMPLANT
STRIP CLOSURE SKIN 1/2X4 (GAUZE/BANDAGES/DRESSINGS) ×2 IMPLANT
SUT ETHILON 2 0 FS 18 (SUTURE) IMPLANT
SUT VIC AB 0 CT1 18XCR BRD8 (SUTURE) ×1 IMPLANT
SUT VIC AB 0 CT1 8-18 (SUTURE) ×2
SUT VIC AB 2-0 CP2 18 (SUTURE) ×6 IMPLANT
SYR 20ML LL LF (SYRINGE) ×3 IMPLANT
TOWEL GREEN STERILE (TOWEL DISPOSABLE) ×3 IMPLANT
TOWEL GREEN STERILE FF (TOWEL DISPOSABLE) ×3 IMPLANT
TRAY FOLEY MTR SLVR 16FR STAT (SET/KITS/TRAYS/PACK) IMPLANT
WATER STERILE IRR 1000ML POUR (IV SOLUTION) ×3 IMPLANT

## 2019-12-25 NOTE — Transfer of Care (Signed)
Immediate Anesthesia Transfer of Care Note  Patient: Christina Sullivan  Procedure(s) Performed: POSTERIOR CERVICAL FUSION WITH LATERAL MASS FIXATION CERVICAL FIVE- CERVICAL SIX (N/A Spine Cervical)  Patient Location: PACU  Anesthesia Type:General  Level of Consciousness: drowsy and patient cooperative  Airway & Oxygen Therapy: Patient Spontanous Breathing and Patient connected to face mask oxygen  Post-op Assessment: Report given to RN and Post -op Vital signs reviewed and stable  Post vital signs: Reviewed and stable  Last Vitals:  Vitals Value Taken Time  BP 168/80 12/25/19 0956  Temp    Pulse 68 12/25/19 0957  Resp 13 12/25/19 0957  SpO2 98 % 12/25/19 0957  Vitals shown include unvalidated device data.  Last Pain:  Vitals:   12/25/19 0623  TempSrc: Oral  PainSc:       Patients Stated Pain Goal: 5 ([id number])  Complications: No apparent anesthesia complications

## 2019-12-25 NOTE — Op Note (Signed)
Brief history: The patient is a 69 year old white female who is had a C4-5 and C5-6 anterior cervical discectomy, fusion and plating.  She initially did well but has developed recurrent and worsening neck pain.  She has failed medical management.  She was worked up with cervical x-rays which demonstrated findings consistent with a cervical pseudoarthrosis.  I discussed the various treatment options with the patient.  She has decided to proceed with surgery after weighing the risks, benefits and alternatives.  Pre-op diagnosis: Cervical pseudoarthrosis, cervicalgia   Postop diagnosis: The same  Procedure: Posterior cervical fusion C4-5 and C5-6 with local morselized autograft bone, bone morphogenic protein soaked collagen sponges, and intrarow bone graft extender; posterior cervical instrumentation C4-5 and C5-6 with Zimmer titanium lateral mass screws and rods  Surgeon: Dr. Earle Gell  Assistant: Arnetha Massy nurse practitioner  Anesthesia: General endotracheal  Estimated blood loss: Minimal  Specimens: None  Drains: None  Complications: None  Description of procedure: The patient was brought to the operating room by the anesthesia team.  General endotracheal anesthesia was induced.  I applied the [place] three-point headrest to the patient's calvarium.  She was carefully turned to the prone position on the chest rolls.  The patient's suboccipital region was then shaved with clippers in this region as well as her posterior neck and thorax was prepared with Betadine scrub and Betadine solution.  Sterile drapes were applied.  I injected the area to be incised with Marcaine with epinephrine solution.  I used the scalpel to make a linear midline incision over the C4-5 and C5-6 interspace.  I used electrocautery to perform a subperiosteal dissection exposing the spinous process lamina and facets at C4-5 and C5-6 bilaterally.  I used the cerebellar retractors for exposure.  We obtained her level  using intraoperative fluoroscopy.  We inspected the facets at C4-5 and C5-6 bilaterally.  C5-6 was obviously still mobile.  It was unclear to me whether there is still some mobility at C4-5 and the right facet had not cleared fully fused.  I therefore decided to fuse both levels.  I used a high-speed drill to decorticate the C4-5 and C5-6 facets, lateral masses, and lateral lamina bilaterally.  I identified the center of the lateral masses at C4, C5 and C6 bilaterally.  We attempted to use intraoperative fluoroscopy to help [place] place instrumentation but we could not see the facets clearly because of the patient's shoulders.  I used the drill guide and drilled 14 mm pilot holes in the cephalad and lateral direction in the bilateral C4, C5 and C6 lateral masses.  I remove the drill and probed inside the drill hole and ruled out cortical breaches.  I then placed 14 mm titanium polyaxial lateral mass screws into the lateral masses bilaterally at C4, C5 and C6.  We got good bony purchase.  I then placed bilateral rods to connect the unilateral screws.  We secured it with the caps which we tightened appropriately.  This completed instrumentation at C4-5 and C5-6.  We completed the fusion by laying bone morphogenic protein soaked collagen sponges, local autograft bone we obtained during the drilling as well as Actifusebone graft extender over the decorticated lateral masses and facets at C4-5 and C5-6 completing the arthrodesis.  We obtained hemostasis using bipolar cautery.  We then remove the retractor.  We injected Exparel.  We then reapproximated patient's cervical fascia with interrupted 0 Vicryl suture.  We then reapproximated the subcutaneous tissue with interrupted 2-0 Vicryl suture.  We reapproximated  the skin with Steri-Strips and benzoin.  The wound was then coated with bacitracin ointment.  A sterile dressing was applied.  The drapes were removed.  The patient was then returned to the supine position.  I  then remove the [place] three-point headrest.  By report all sponge, instrument, and needle counts were correct at the end of this case.

## 2019-12-25 NOTE — Progress Notes (Signed)
Orthopedic Tech Progress Note Patient Details:  Christina Sullivan [birth year] [id number] RN said patient has COLLAR Patient ID: Christina Sullivan, female   DOB: 1950-10-24, 69 y.o.   MRN: [id number]   Christina Sullivan 12/25/2019, 2:11 PM

## 2019-12-25 NOTE — H&P (Signed)
Subjective: The patient is a 69 year old white female who has had a previous anterior cervical discectomy fusion and plating.  She has had worsening neck pain.  She is failed medical management.  She was worked up with cervical x-rays which demonstrated findings consistent with a cervical pseudoarthrosis.  I discussed the various treatment options with her.  She has decided to proceed with surgery.  Past Medical History:  Diagnosis Date  . Anxiety   . Arthritis   . Complication of anesthesia   . Depression   . Hyperlipidemia   . Hypertension   . Hypothyroidism   . Ovarian cancer ([place]) 2004  . Palpitations   . PONV (postoperative nausea and vomiting)   . Skin cancer    face squamous  . 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 ABDOMINAL HYSTERECTOMY    . CATARACT EXTRACTION W/PHACO Left 06/16/2016   Procedure: CATARACT EXTRACTION PHACO AND INTRAOCULAR LENS PLACEMENT (IOC);  Surgeon: Birder Robson, MD;  Location: [facility] ORS;  Service: Ophthalmology;  Laterality: Left;  Lot# [id number] H [place]: 00:50.4 AP%: 20.2 CDE:10.16  . CATARACT EXTRACTION W/PHACO Right 07/07/2016   Procedure: CATARACT EXTRACTION PHACO AND INTRAOCULAR LENS PLACEMENT (IOC);  Surgeon: Birder Robson, MD;  Location: [facility] ORS;  Service: Ophthalmology;  Laterality: Right;  Lot # [id number] H [place]: 00:28.8 AP%:18.8 CDE: 5.43  . CERVICAL FUSION     APPROX 06/14/16  . COLONOSCOPY WITH PROPOFOL N/A 06/21/2017   Procedure: COLONOSCOPY WITH PROPOFOL;  Surgeon: Lollie Sails, MD;  Location: [facility] ENDOSCOPY;  Service: Endoscopy;  Laterality: N/A;  . TONSILLECTOMY      No Known Allergies  Social History   Tobacco Use  . Smoking status: Never Smoker  . Smokeless tobacco: Never Used  Substance Use Topics  . Alcohol use: No    Family History  Problem Relation Age of Onset  . Breast cancer Maternal Aunt    Prior to Admission medications   Medication Sig  Start Date End Date Taking? Authorizing Provider  aspirin 325 MG tablet Take 325 mg by mouth daily.   Yes [provider]  atorvastatin (LIPITOR) 10 MG tablet Take 10 mg by mouth daily.   Yes [provider]  Cholecalciferol (VITAMIN D3) 1000 units CAPS Take 1,000 Units by mouth daily.    Yes [provider]  levothyroxine (SYNTHROID) 75 MCG tablet Take 75 mcg by mouth daily before breakfast.  11/01/19  Yes [provider]  sertraline (ZOLOFT) 50 MG tablet Take 50 mg by mouth daily.    Yes [provider]  triamterene-hydrochlorothiazide (MAXZIDE-25) 37.5-25 MG tablet Take 1 tablet by mouth daily. 06/12/16  Yes [provider]     Review of Systems  Positive ROS: As above  All other systems have been reviewed and were otherwise negative with the exception of those mentioned in the HPI and as above.  Objective: Vital signs in last 24 hours: Temp:  [98.2 F (36.8 C)] 98.2 F (36.8 C) (04/26 0615) Pulse Rate:  [72] 72 (04/26 0619) Resp:  [18] 18 (04/26 0615) BP: (147)/(71) 147/71 (04/26 0619) SpO2:  [95 %] 95 % (04/26 0619) Weight:  [118.8 kg] 118.8 kg (04/26 0619) Estimated body mass index is 37.59 kg/m as calculated from the following:   Height as of this encounter: 5\' 10"  (1.778 m).   Weight as of this encounter: 118.8 kg.   General Appearance: Alert Head: Normocephalic, without obvious abnormality, atraumatic Eyes:  PERRL, conjunctiva/corneas clear, EOM's intact,    Ears: Normal  Throat: Normal  Neck: The patient's cervical incision is well-healed., Back: unremarkable Lungs: Clear to auscultation bilaterally, respirations unlabored Heart: Regular rate and rhythm, no murmur, rub or gallop Abdomen: Soft, non-tender Extremities: Extremities normal, atraumatic, no cyanosis or edema Skin: unremarkable  NEUROLOGIC:   Mental status: alert and oriented,Motor Exam - grossly normal Sensory Exam - grossly normal Reflexes:   Coordination - grossly normal Gait - grossly normal Balance - grossly normal Cranial Nerves: I: smell Not tested  II: visual acuity  OS: Normal  OD: Normal   II: visual fields Full to confrontation  II: pupils Equal, round, reactive to light  III,VII: ptosis None  III,IV,VI: extraocular muscles  Full ROM  V: mastication Normal  V: facial light touch sensation  Normal  V,VII: corneal reflex  Present  VII: facial muscle function - upper  Normal  VII: facial muscle function - lower Normal  VIII: hearing Not tested  IX: soft palate elevation  Normal  IX,X: gag reflex Present  XI: trapezius strength  5/5  XI: sternocleidomastoid strength 5/5  XI: neck flexion strength  5/5  XII: tongue strength  Normal    Data Review Lab Results  Component Value Date   WBC 9.6 12/22/2019   HGB 16.5 (H) 12/22/2019   HCT 49.6 (H) 12/22/2019   MCV 92.0 12/22/2019   PLT 354 12/22/2019   Lab Results  Component Value Date   NA 141 12/22/2019   K 4.0 12/22/2019   CL 101 12/22/2019   CO2 28 12/22/2019   BUN 21 12/22/2019   CREATININE 1.09 (H) 12/22/2019   GLUCOSE 113 (H) 12/22/2019   No results found for: INR, PROTIME  Assessment/Plan: Cervical pseudoarthrosis, cervicalgia: I have discussed the situation with the patient.  I have reviewed her imaging studies with her and pointed out the abnormalities.  We have discussed the various treatment options including surgery.  I have described the surgical treatment option of a posterior cervical instrumentation and fusion.  I have shown her surgical models.  I have discussed the risks, benefits, alternatives, expected postoperative course, and likelihood of achieving our goals with surgery.  I have answered all her questions.  She has decided to proceed with surgery.   Christina Sullivan 12/25/2019 7:26 AM

## 2019-12-25 NOTE — Anesthesia Procedure Notes (Addendum)
Procedure Name: Intubation Date/Time: 12/25/2019 7:39 AM Performed by: Lance Coon, CRNA Pre-anesthesia Checklist: Patient identified, Emergency Drugs available, Suction available, Patient being monitored and Timeout performed Patient Re-evaluated:Patient Re-evaluated prior to induction Oxygen Delivery Method: Circle system utilized Preoxygenation: Pre-oxygenation with 100% oxygen Induction Type: IV induction Ventilation: Mask ventilation without difficulty Laryngoscope Size: Miller and 3 Grade View: Grade II Tube type: Oral Tube size: 7.5 mm Number of attempts: 1 Airway Equipment and Method: Stylet Placement Confirmation: ETT inserted through vocal cords under direct vision,  positive ETCO2 and breath sounds checked- equal and bilateral Secured at: 21 cm Tube secured with: Tape Dental Injury: Teeth and Oropharynx as per pre-operative assessment

## 2019-12-25 NOTE — Anesthesia Postprocedure Evaluation (Signed)
Anesthesia Post Note  Patient: Christina Sullivan  Procedure(s) Performed: POSTERIOR CERVICAL FUSION WITH LATERAL MASS FIXATION CERVICAL FIVE- CERVICAL SIX (N/A Spine Cervical)     Patient location during evaluation: PACU Anesthesia Type: General Level of consciousness: awake and alert Pain management: pain level controlled Vital Signs Assessment: post-procedure vital signs reviewed and stable Respiratory status: spontaneous breathing, nonlabored ventilation and respiratory function stable Cardiovascular status: blood pressure returned to baseline and stable Postop Assessment: no apparent nausea or vomiting Anesthetic complications: no    Last Vitals:  Vitals:   12/25/19 1057 12/25/19 1120  BP: (!) 151/66 124/68  Pulse: 71 72  Resp: 15 18  Temp:  36.6 C  SpO2: 92% 96%    Last Pain:  Vitals:   12/25/19 1120  TempSrc: Oral  PainSc:     LLE Motor Response: Purposeful movement (12/25/19 1120) LLE Sensation: Full sensation (12/25/19 1120) RLE Motor Response: Purposeful movement (12/25/19 1120) RLE Sensation: Full sensation (12/25/19 1120)      Sidnee Gambrill,W. EDMOND

## 2019-12-26 MED ORDER — OXYCODONE-ACETAMINOPHEN 5-325 MG PO TABS: 1.0000 | ORAL_TABLET | ORAL | 0 refills | Status: AC | PRN

## 2019-12-26 MED ORDER — DOCUSATE SODIUM 100 MG PO CAPS: 100.0000 mg | ORAL_CAPSULE | Freq: Two times a day (BID) | ORAL | 0 refills | Status: AC

## 2019-12-26 MED ORDER — OXYCODONE-ACETAMINOPHEN 5-325 MG PO TABS: 1.0000 | ORAL_TABLET | ORAL | Status: DC | PRN

## 2019-12-26 MED ORDER — CYCLOBENZAPRINE HCL 10 MG PO TABS: 10.0000 mg | ORAL_TABLET | Freq: Three times a day (TID) | ORAL | 0 refills | Status: AC | PRN

## 2019-12-26 NOTE — Discharge Summary (Signed)
Physician Discharge Summary  Patient ID: Christina Sullivan MRN: [id number] DOB/AGE: Jul 19, 1951 69 y.o.  Admit date: 12/25/2019 Discharge date: 12/26/2019  Admission Diagnoses: Cervical pseudoarthrosis, cervicalgia  Discharge Diagnoses: The same Active Problems:   Cervical pseudoarthrosis [facility])   Discharged Condition: good  Hospital Course: I performed a posterior C4-5 and C5-6 instrumentation and fusion on patient on 12/25/2019.  The surgery went well.  The patient's postoperative course was unremarkable.  On postoperative day #1 she requested discharge home.  She was given written and oral discharge instructions.  All questions were answered.  Consults: OT Significant Diagnostic Studies: None Treatments: Posterior C4-5 and C5-6 instrumentation and fusion Discharge Exam: Blood pressure 106/71, pulse 63, temperature 98.2 F (36.8 C), temperature source Oral, resp. rate 16, height 5\' 10"  (1.778 m), weight 118.8 kg, SpO2 97 %. The patient is alert and pleasant.  She looks well.  Her strength is normal.  Disposition: Home  Discharge Instructions    Call MD for:  difficulty breathing, headache or visual disturbances   Complete by: As directed    Call MD for:  extreme fatigue   Complete by: As directed    Call MD for:  hives   Complete by: As directed    Call MD for:  persistant dizziness or light-headedness   Complete by: As directed    Call MD for:  persistant nausea and vomiting   Complete by: As directed    Call MD for:  redness, tenderness, or signs of infection (pain, swelling, redness, odor or green/yellow discharge around incision site)   Complete by: As directed    Call MD for:  severe uncontrolled pain   Complete by: As directed    Call MD for:  temperature >100.4   Complete by: As directed    Diet - low sodium heart healthy   Complete by: As directed    Discharge instructions   Complete by: As directed    Call [phone] for a followup appointment. Take a stool  softener while you are using pain medications.   Driving Restrictions   Complete by: As directed    Do not drive for 2 weeks.   Increase activity slowly   Complete by: As directed    Lifting restrictions   Complete by: As directed    Do not lift more than 5 pounds. No excessive bending or twisting.   May shower / Bathe   Complete by: As directed    Remove the dressing for 3 days after surgery.  You may shower, but leave the incision alone.   Remove dressing in 48 hours   Complete by: As directed    Your stitches are under the scan and will dissolve by themselves. The Steri-Strips will fall off after you take a few showers. Do not rub back or pick at the wound, Leave the wound alone.     Allergies as of 12/26/2019   No Known Allergies     Medication List    TAKE these medications   aspirin 325 MG tablet Take 325 mg by mouth daily.   atorvastatin 10 MG tablet Commonly known as: LIPITOR Take 10 mg by mouth daily.   cyclobenzaprine 10 MG tablet Commonly known as: FLEXERIL Take 1 tablet (10 mg total) by mouth 3 (three) times daily as needed for muscle spasms.   docusate sodium 100 MG capsule Commonly known as: COLACE Take 1 capsule (100 mg total) by mouth 2 (two) times daily.   levothyroxine 75 MCG tablet Commonly known  as: SYNTHROID Take 75 mcg by mouth daily before breakfast.   oxyCODONE-acetaminophen 5-325 MG tablet Commonly known as: PERCOCET/ROXICET Take 1-2 tablets by mouth every 4 (four) hours as needed for moderate pain.   sertraline 50 MG tablet Commonly known as: ZOLOFT Take 50 mg by mouth daily.   triamterene-hydrochlorothiazide 37.5-25 MG tablet Commonly known as: MAXZIDE-25 Take 1 tablet by mouth daily.   Vitamin D3 25 MCG (1000 UT) Caps Take 1,000 Units by mouth daily.        Signed: Ophelia Charter 12/26/2019, 7:45 AM

## 2019-12-26 NOTE — Evaluation (Signed)
Occupational Therapy Evaluation Patient Details Name: Christina Sullivan MRN: [id number] DOB: [birth year] Today's Date: 12/26/2019    History of Present Illness 69 yo female s/p C4-6 fusion on 12/25/19. PMH including anxiety, arthritis, depression, HTN, ovarian cancer, prior cervical sx, and catarct sx.    Clinical Impression   PTA, pt was living with her husband and was independent. Pt currently performing ADLs and functional mobility at Mod I level with increased time as needed. Providing education on cervical precautions, collar management, UB ADLs, LB ADLs, grooming, toileting, and shower transfer. Pt demonstrating and verbalizing understanding. Recommend dc to home once medically stable per physician. All acute OT needs met and will sign off. Thank you.    Follow Up Recommendations  No OT follow up;Supervision - Intermittent    Equipment Recommendations  None recommended by OT    Recommendations for Other Services PT consult     Precautions / Restrictions Precautions Precautions: Cervical Precaution Booklet Issued: Yes (comment) Precaution Comments: Provided handout and education on compensatory techniques for ADLs Required Braces or Orthoses: Cervical Brace Cervical Brace: Hard collar;At all times Restrictions Weight Bearing Restrictions: No      Mobility Bed Mobility Overal bed mobility: Modified Independent             General bed mobility comments: Educating pt on log roll technique. Mod I with increased time demonstrating understanding  Transfers Overall transfer level: Independent                    Balance Overall balance assessment: No apparent balance deficits (not formally assessed)                                         ADL either performed or assessed with clinical judgement   ADL Overall ADL's : Modified independent                                       General ADL Comments: Educating pt on compenstory  techniques for ADLs. Educating on bed mobility, cervical precautions, collar management, UB ADLs, LB ADLs, toileting, and shower transfer     Vision         Perception     Praxis      Pertinent Vitals/Pain Pain Assessment: Faces Faces Pain Scale: Hurts a little bit Pain Location: Neck Pain Descriptors / Indicators: Sore Pain Intervention(s): Monitored during session     Hand Dominance Right   Extremity/Trunk Assessment Upper Extremity Assessment Upper Extremity Assessment: Overall WFL for tasks assessed   Lower Extremity Assessment Lower Extremity Assessment: Overall WFL for tasks assessed   Cervical / Trunk Assessment Cervical / Trunk Assessment: Other exceptions Cervical / Trunk Exceptions: s/p cervical fusion   Communication Communication Communication: No difficulties   Cognition                                           General Comments       Exercises     Shoulder Instructions      Home Living Family/patient expects to be discharged to:: Private residence Living Arrangements: Spouse/significant other Available Help at Discharge: Family;Available 24 hours/day Type of Home: House Home Access: Stairs to enter [employer]  of Steps: 3 Entrance Stairs-Rails: Can reach both Home Layout: One level     Bathroom Shower/Tub: Occupational psychologist: Standard     Home Equipment: Environmental consultant - 2 wheels;Cane - single point;Shower seat - built in;Hand held shower head          Prior Functioning/Environment Level of Independence: Independent                 OT Problem List: Decreased activity tolerance;Decreased range of motion;Decreased knowledge of precautions      OT Treatment/Interventions:      OT Goals(Current goals can be found in the care plan section) Acute Rehab OT Goals Patient Stated Goal: Go home today OT Goal Formulation: All assessment and education complete, DC therapy  OT Frequency:      Barriers to D/C:            Co-evaluation              AM-PAC OT "6 Clicks" Daily Activity     Outcome Measure Help from another person eating meals?: None Help from another person taking care of personal grooming?: None Help from another person toileting, which includes using toliet, bedpan, or urinal?: None Help from another person bathing (including washing, rinsing, drying)?: None Help from another person to put on and taking off regular upper body clothing?: None Help from another person to put on and taking off regular lower body clothing?: None 6 Click Score: 24   End of Session Equipment Utilized During Treatment: Cervical collar Nurse Communication: Mobility status  Activity Tolerance: Patient tolerated treatment well Patient left: in bed;with call bell/phone within reach  OT Visit Diagnosis: Other abnormalities of gait and mobility (R26.89);Muscle weakness (generalized) (M62.81);Pain Pain - part of body: (Neck)                Time: 6546-5035 OT Time Calculation (min): 12 min Charges:  OT General Charges $OT Visit: 1 Visit OT Evaluation $OT Eval Low Complexity: [place], OTR/L Acute Rehab Pager: [phone] Office: [place] 12/26/2019, 8:08 AM

## 2019-12-26 NOTE — Discharge Instructions (Signed)

## 2019-12-26 NOTE — Progress Notes (Signed)
Patient is discharged from room 3C07 at this time. Alert and in stable condition. IV site d/c'd and instructions read to patient and spouse with understanding verbalized and all questions answered. Left unit via wheelchair with all belongings at side. 

## 2019-12-27 ENCOUNTER — Encounter: Payer: Self-pay | Admitting: *Deleted

## 2021-05-12 IMAGING — MG DIGITAL SCREENING BILAT W/ TOMO W/ CAD
8 series · 8 of 24 positions shown · non-contrast
Comparison: Previous exam(s).

ACR Breast Density Category a: The breast tissue is almost entirely
fatty.

CLINICAL DATA: Screening.

EXAM:
DIGITAL SCREENING BILATERAL MAMMOGRAM WITH TOMO AND CAD

[R CC synth-2D]
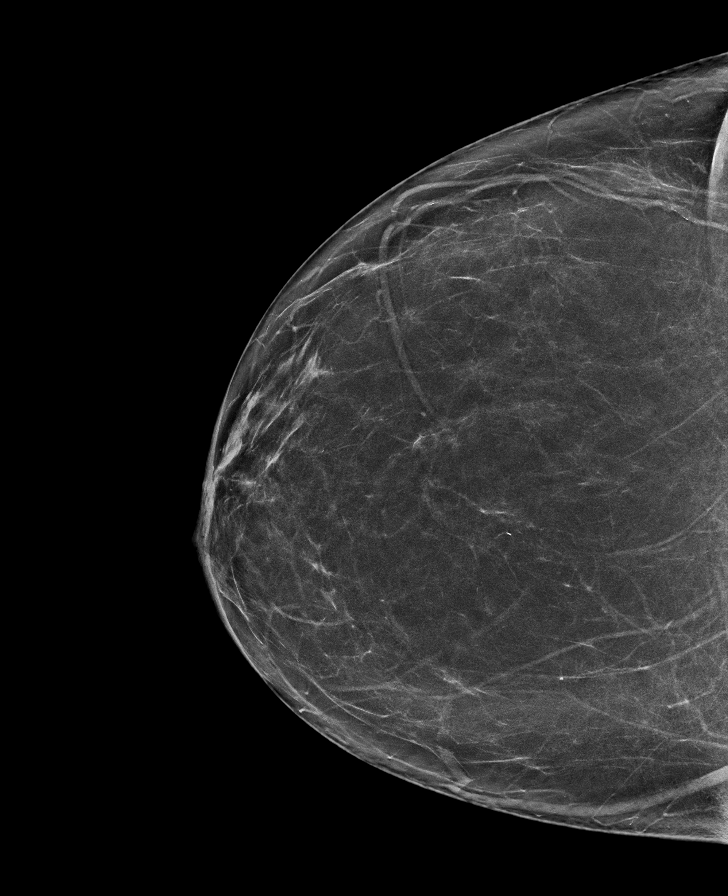

[R MLO synth-2D]
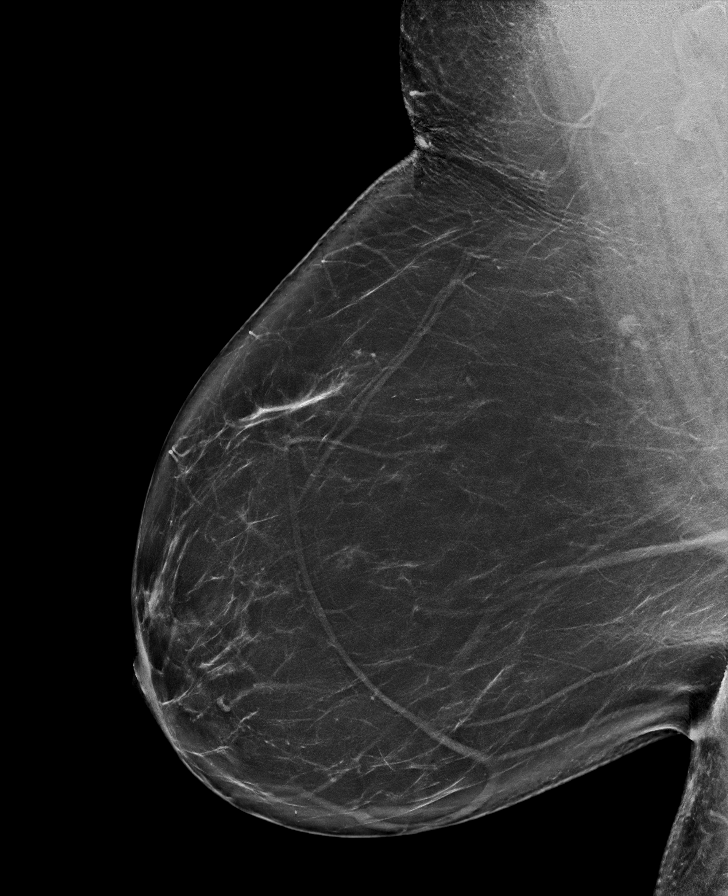

[L MLO synth-2D]
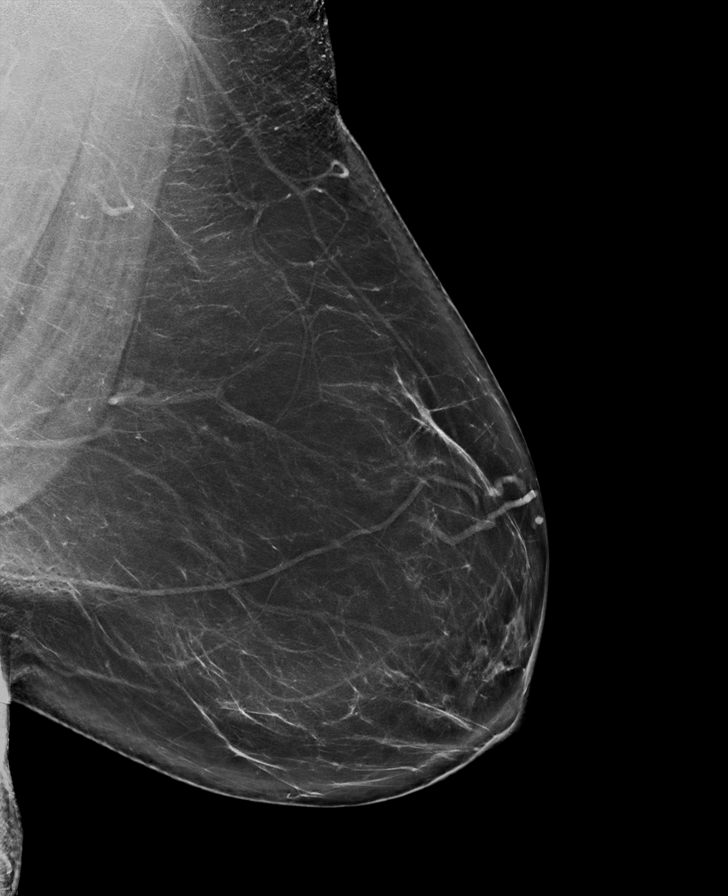

[L CC synth-2D]
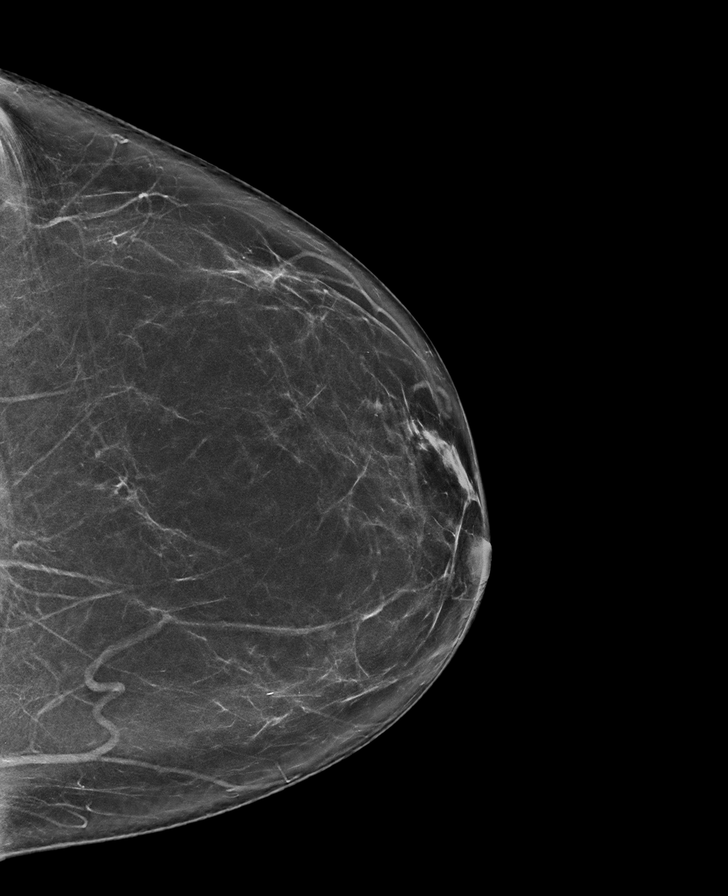

[L CC tomo · tomo slice 39/77.0]
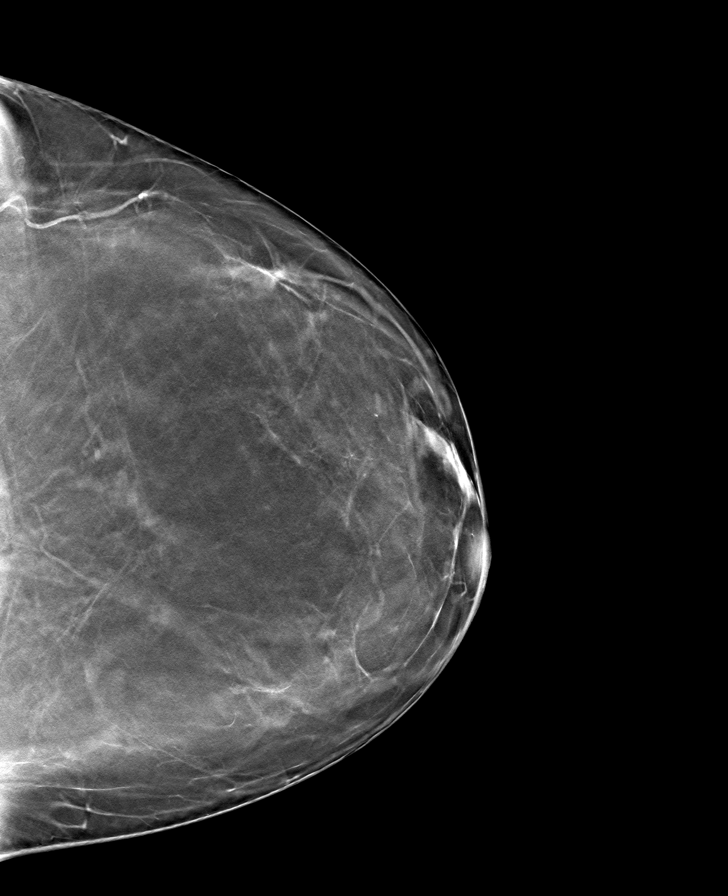

[L MLO tomo · tomo slice 46/91.0]
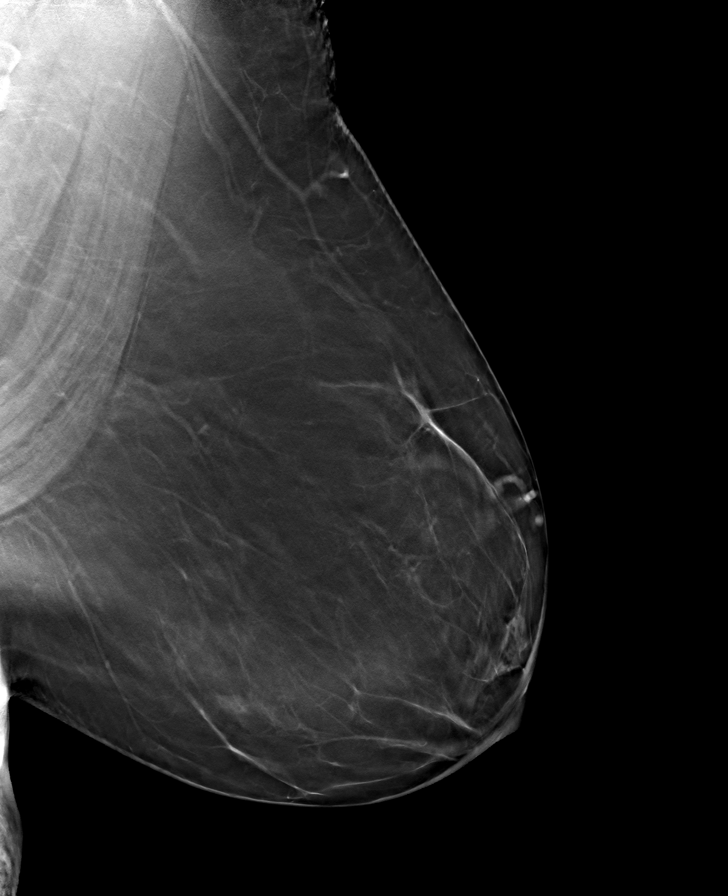

[R CC tomo · tomo slice 38/75.0]
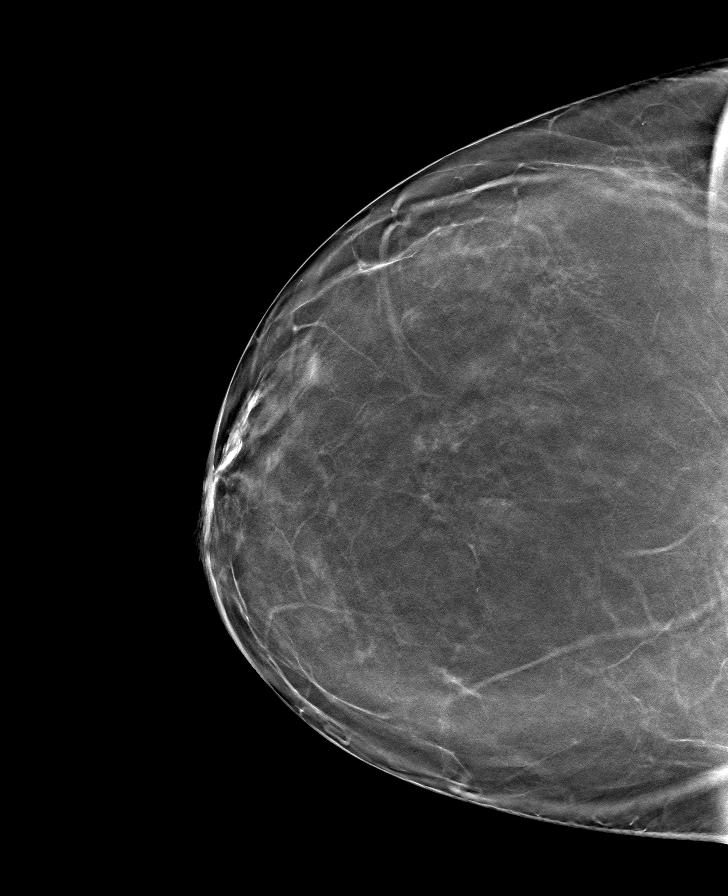

[R MLO tomo · tomo slice 47/93.0]
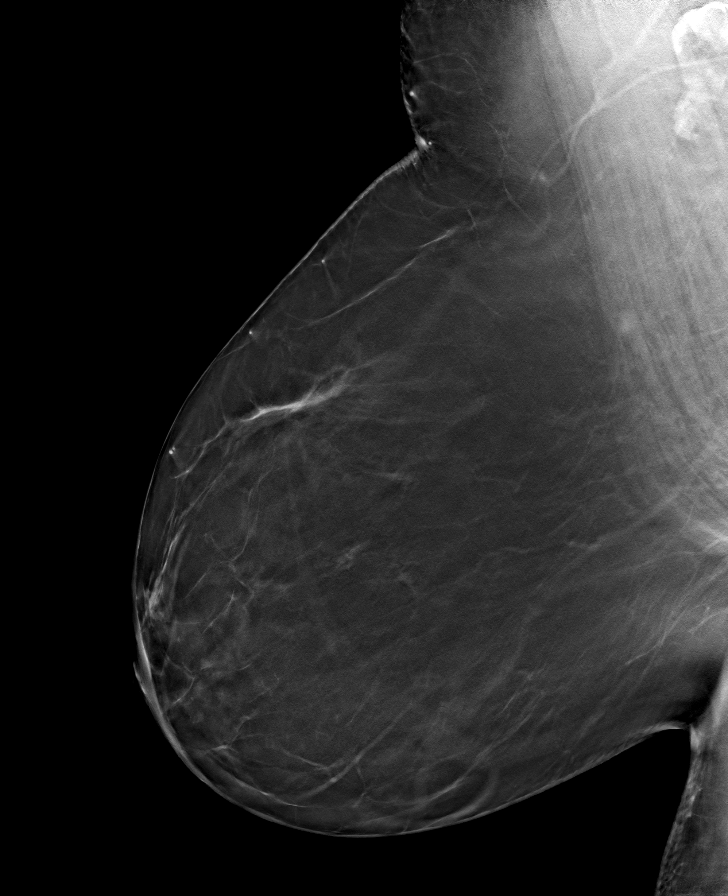

[8 of 24 positions shown; findings below may reference images not displayed]

FINDINGS: There are no findings suspicious for malignancy. Images were
processed with CAD.
IMPRESSION: No mammographic evidence of malignancy. A result letter of this
screening mammogram will be mailed directly to the patient.

RECOMMENDATION:
Screening mammogram in one year. (Code:[id number])

BI-RADS CATEGORY  1: Negative.
